# Patient Record
Sex: Male | Born: 1980 | Race: Black or African American | Hispanic: No | Marital: Married | Smoking: Never smoker
Health system: Southern US, Community
[De-identification: ages and names within clinical notes are randomized; demographics above are authoritative.]

## PROBLEM LIST (undated history)

## (undated) DIAGNOSIS — J45909 Unspecified asthma, uncomplicated: Secondary | ICD-10-CM

---

## 2010-08-03 ENCOUNTER — Emergency Department (HOSPITAL_COMMUNITY)
Admission: EM | Admit: 2010-08-03 | Discharge: 2010-08-03 | Payer: Self-pay | Source: Home / Self Care | Admitting: Emergency Medicine

## 2012-07-16 ENCOUNTER — Emergency Department (HOSPITAL_COMMUNITY): Payer: Self-pay

## 2012-07-16 ENCOUNTER — Emergency Department (HOSPITAL_COMMUNITY)
Admission: EM | Admit: 2012-07-16 | Discharge: 2012-07-17 | Disposition: A | Discharge status: Home / Self Care | Payer: Self-pay | Attending: Emergency Medicine | Admitting: Emergency Medicine

## 2012-07-16 ENCOUNTER — Encounter (HOSPITAL_COMMUNITY): Payer: Self-pay | Admitting: *Deleted

## 2012-07-16 DIAGNOSIS — Z79899 Other long term (current) drug therapy: Secondary | ICD-10-CM | POA: Insufficient documentation

## 2012-07-16 DIAGNOSIS — R059 Cough, unspecified: Secondary | ICD-10-CM | POA: Insufficient documentation

## 2012-07-16 DIAGNOSIS — J029 Acute pharyngitis, unspecified: Secondary | ICD-10-CM | POA: Insufficient documentation

## 2012-07-16 DIAGNOSIS — J3489 Other specified disorders of nose and nasal sinuses: Secondary | ICD-10-CM | POA: Insufficient documentation

## 2012-07-16 DIAGNOSIS — J45909 Unspecified asthma, uncomplicated: Secondary | ICD-10-CM

## 2012-07-16 DIAGNOSIS — J45901 Unspecified asthma with (acute) exacerbation: Secondary | ICD-10-CM | POA: Insufficient documentation

## 2012-07-16 DIAGNOSIS — R071 Chest pain on breathing: Secondary | ICD-10-CM | POA: Insufficient documentation

## 2012-07-16 DIAGNOSIS — R05 Cough: Secondary | ICD-10-CM | POA: Insufficient documentation

## 2012-07-16 HISTORY — DX: Unspecified asthma, uncomplicated: J45.909

## 2012-07-16 LAB — CBC WITH DIFFERENTIAL/PLATELET
Basophils Absolute: 0 10*3/uL (ref 0.0–0.1)
Basophils Relative: 1 % (ref 0–1)
Eosinophils Absolute: 0.7 10*3/uL (ref 0.0–0.7)
Eosinophils Relative: 10 % — ABNORMAL HIGH (ref 0–5)
MCH: 26.5 pg (ref 26.0–34.0)
MCV: 79.5 fL (ref 78.0–100.0)
Neutrophils Relative %: 34 % — ABNORMAL LOW (ref 43–77)
Platelets: 198 10*3/uL (ref 150–400)
RDW: 13.5 % (ref 11.5–15.5)

## 2012-07-16 LAB — D-DIMER, QUANTITATIVE: D-Dimer, Quant: <0.27 ug/mL-FEU (ref 0.00–0.48)

## 2012-07-16 LAB — BASIC METABOLIC PANEL
Calcium: 9.3 mg/dL (ref 8.4–10.5)
GFR calc Af Amer: >90 mL/min (ref >90)
GFR calc non Af Amer: 80 mL/min — ABNORMAL LOW (ref >90)
Potassium: 3.9 mEq/L (ref 3.5–5.1)
Sodium: 136 mEq/L (ref 135–145)

## 2012-07-16 MED ORDER — PREDNISONE 50 MG PO TABS: ORAL_TABLET | ORAL | Status: DC

## 2012-07-16 MED ORDER — ALBUTEROL SULFATE (5 MG/ML) 0.5% IN NEBU
5.0000 mg | INHALATION_SOLUTION | Freq: Once | RESPIRATORY_TRACT | Status: AC
  Administered 2012-07-16: 5 mg via RESPIRATORY_TRACT
  Filled 2012-07-16: qty 1

## 2012-07-16 MED ORDER — IPRATROPIUM BROMIDE 0.02 % IN SOLN
0.5000 mg | Freq: Once | RESPIRATORY_TRACT | Status: AC
  Administered 2012-07-16: 0.5 mg via RESPIRATORY_TRACT
  Filled 2012-07-16: qty 2.5

## 2012-07-16 MED ORDER — PREDNISONE 20 MG PO TABS
60.0000 mg | ORAL_TABLET | Freq: Once | ORAL | Status: AC
  Administered 2012-07-16: 60 mg via ORAL
  Filled 2012-07-16: qty 3

## 2012-07-16 MED ORDER — ALBUTEROL SULFATE (5 MG/ML) 0.5% IN NEBU
5.0000 mg | INHALATION_SOLUTION | Freq: Once | RESPIRATORY_TRACT | Status: AC
  Administered 2012-07-16: 5 mg via RESPIRATORY_TRACT

## 2012-07-16 MED ORDER — ALBUTEROL SULFATE HFA 108 (90 BASE) MCG/ACT IN AERS: 2.0000 | INHALATION_SPRAY | RESPIRATORY_TRACT | Status: DC | PRN

## 2012-07-16 MED ORDER — ALBUTEROL SULFATE (5 MG/ML) 0.5% IN NEBU
INHALATION_SOLUTION | RESPIRATORY_TRACT | Status: AC
  Filled 2012-07-16: qty 1

## 2012-07-16 NOTE — ED Provider Notes (Signed)
History     CSN: [id number]  Arrival date & time 07/16/12  1941   First MD Initiated Contact with Patient 07/16/12 2045      Chief Complaint  Patient presents with  . Shortness of Breath  . Asthma    (Consider location/radiation/quality/duration/timing/severity/associated sxs/prior treatment) HPI Comments: Patient reports history of asthma and difficulty breathing for the past day. Associated with dry cough, runny nose, sore throat, congestion. Denies any fevers. Has some anterior chest pain with coughing and deep breathing. No abdominal pain, nausea or vomiting. Has not been in the hospital for asthma since she was a child. Does not smoke. Does not use any asthma medications at home.  The history is provided by the patient.    Past Medical History  Diagnosis Date  . Asthma     History reviewed. No pertinent past surgical history.  No family history on file.  History  Substance Use Topics  . Smoking status: Never Smoker   . Smokeless tobacco: Not on file  . Alcohol Use: No      Review of Systems  Constitutional: Negative for fever, activity change and appetite change.  HENT: Positive for congestion and rhinorrhea. Negative for sore throat.   Respiratory: Positive for cough and shortness of breath.   Cardiovascular: Positive for chest pain.  Gastrointestinal: Negative for nausea, vomiting and abdominal pain.  Genitourinary: Negative for dysuria and hematuria.  Musculoskeletal: Negative for back pain.  Skin: Negative for rash.  Neurological: Negative for dizziness, weakness and headaches.  A complete 10 system review of systems was obtained and all systems are negative except as noted in the HPI and PMH.    Allergies  Review of patient's allergies indicates no known allergies.  Home Medications   Current Outpatient Rx  Name  Route  Sig  Dispense  Refill  . PSEUDOEPH-DOXYLAMINE-DM-APAP 60-7.12-22-998 MG/30ML PO LIQD   Oral   Take 10 mLs by mouth at bedtime  as needed. For cough/cold         . ALBUTEROL SULFATE HFA 108 (90 BASE) MCG/ACT IN AERS   Inhalation   Inhale 2 puffs into the lungs every 4 (four) hours as needed for wheezing.   1 Inhaler   0   . PREDNISONE 50 MG PO TABS      1 tablet PO daily   5 tablet   0     BP 128/83  Pulse 77  Temp 98.4 F (36.9 C) (Oral)  Resp 16  SpO2 98%  Physical Exam  Constitutional: He is oriented to person, place, and time. He appears well-developed and well-nourished. No distress.       Speaking in full senses, no distress  HENT:  Head: Normocephalic and atraumatic.  Mouth/Throat: Oropharynx is clear and moist. No oropharyngeal exudate.  Eyes: Conjunctivae normal and EOM are normal. Pupils are equal, round, and reactive to light.  Neck: Normal range of motion. Neck supple.  Cardiovascular: Normal rate, regular rhythm and normal heart sounds.   No murmur heard. Pulmonary/Chest: Effort normal. No respiratory distress. He has wheezes.       Scattered wheezes bilaterally  Abdominal: Soft. There is no tenderness. There is no rebound and no guarding.  Musculoskeletal: Normal range of motion. He exhibits no edema and no tenderness.  Neurological: He is alert and oriented to person, place, and time. No cranial nerve deficit. He exhibits normal muscle tone. Coordination normal.  Skin: Skin is warm.    ED Course  Procedures (including critical care time)  Labs Reviewed  CBC WITH DIFFERENTIAL - Abnormal; Notable for the following:    Neutrophils Relative 34 (*)     Monocytes Relative 14 (*)     Eosinophils Relative 10 (*)     All other components within normal limits  BASIC METABOLIC PANEL - Abnormal; Notable for the following:    Glucose, Bld 104 (*)     GFR calc non Af Amer 80 (*)     All other components within normal limits  D-DIMER, QUANTITATIVE   Dg Chest 2 View  07/16/2012  *RADIOLOGY REPORT*  Clinical Data: Chest pain, cough and congestion.  CHEST - 2 VIEW  Comparison: None.   Findings: Lungs are clear.  Heart size is normal.  No pneumothorax or pleural fluid.  IMPRESSION: Negative chest.   Original Report Authenticated By: Holley Dexter, M.D.      1. Asthma       MDM  Cough, congestion, wheezing, history of asthma. Scattered wheezes on exam. No distress. D-dimer negative. Chest x-ray negative. Breathing improved after nebulizer and steroids. Patient speaking in full sentences in no distress.  We'll treat her asthma exacerbation with steroids and nebulizers. He is ambulatory in the ED without desaturation.      Glynn Octave, MD 07/16/12 [phone]

## 2012-07-16 NOTE — ED Notes (Addendum)
C/o sob, wheezing, cough (intermittant productive yellow thick), h/o asthma, onset 1d ago, (denies fever), no meds PTA. Last flare up years ago. Speaking in short phrases, insp/exp wheeze noted.

## 2012-08-03 ENCOUNTER — Emergency Department (HOSPITAL_COMMUNITY)
Admission: EM | Admit: 2012-08-03 | Discharge: 2012-08-03 | Disposition: A | Discharge status: Home / Self Care | Payer: Self-pay | Attending: Emergency Medicine | Admitting: Emergency Medicine

## 2012-08-03 ENCOUNTER — Emergency Department (HOSPITAL_COMMUNITY): Payer: Self-pay

## 2012-08-03 ENCOUNTER — Encounter (HOSPITAL_COMMUNITY): Payer: Self-pay | Admitting: Emergency Medicine

## 2012-08-03 DIAGNOSIS — Y939 Activity, unspecified: Secondary | ICD-10-CM | POA: Insufficient documentation

## 2012-08-03 DIAGNOSIS — S93409A Sprain of unspecified ligament of unspecified ankle, initial encounter: Secondary | ICD-10-CM | POA: Insufficient documentation

## 2012-08-03 DIAGNOSIS — W010XXA Fall on same level from slipping, tripping and stumbling without subsequent striking against object, initial encounter: Secondary | ICD-10-CM | POA: Insufficient documentation

## 2012-08-03 DIAGNOSIS — Y9269 Other specified industrial and construction area as the place of occurrence of the external cause: Secondary | ICD-10-CM | POA: Insufficient documentation

## 2012-08-03 DIAGNOSIS — J45909 Unspecified asthma, uncomplicated: Secondary | ICD-10-CM | POA: Insufficient documentation

## 2012-08-03 DIAGNOSIS — Y990 Civilian activity done for income or pay: Secondary | ICD-10-CM | POA: Insufficient documentation

## 2012-08-03 MED ORDER — OXYCODONE-ACETAMINOPHEN 5-325 MG PO TABS
2.0000 | ORAL_TABLET | Freq: Once | ORAL | Status: AC
  Administered 2012-08-03: 2 via ORAL
  Filled 2012-08-03: qty 2

## 2012-08-03 MED ORDER — OXYCODONE-ACETAMINOPHEN 5-325 MG PO TABS: 2.0000 | ORAL_TABLET | ORAL | Status: DC | PRN

## 2012-08-03 NOTE — ED Notes (Signed)
Patient transported to X-ray 

## 2012-08-03 NOTE — ED Notes (Addendum)
Pt here with c/o left ankle pain that he injured his at work this morning. Pt states stepped on the card wood box slipped. Swelling noted at lateral left ankle. He denies fall or hitting head. Reports pain left ankles pain 10/10.

## 2012-08-03 NOTE — Progress Notes (Signed)
Orthopedic Tech Progress Note Patient Details:  Charles Chase 1980/07/31 [id number] Ankle brace applied to Left LE. Patient tolerated application well. Crutches fitted to patient height and comfort.  Ortho Devices Type of Ortho Device: ASO Ortho Device/Splint Location: Left  Ortho Device/Splint Interventions: Application   [name] 08/03/2012, 10:50 AM

## 2012-08-03 NOTE — ED Provider Notes (Signed)
Medical screening examination/treatment/procedure(s) were performed by non-physician practitioner and as supervising physician I was immediately available for consultation/collaboration.   Richardean Canal, MD 08/03/12 [phone]

## 2012-08-03 NOTE — ED Provider Notes (Signed)
History     CSN: [id number]  Arrival date & time 08/03/12  5366   First MD Initiated Contact with Patient 08/03/12 [phone]      Chief Complaint  Patient presents with  . Ankle Injury    (Consider location/radiation/quality/duration/timing/severity/associated sxs/prior treatment) HPI Comments: Patient is a 32 year old male who presents with left ankle pain that started today. The mechanism of injury was sudden ankle inversion. Patient reports sudden onset of aching, severe pain that is localized to left ankle. Patient reports progressive worsening of pain. Ankle movement and weight bearing activity make the pain worse. Nothing makes the pain better. Patient reports associated swelling. Patient has not tried anything for pain relief. Patient denies obvious deformity, numbness/tingling, coolness/weakness of extremity, bruising, and any other injury.      Past Medical History  Diagnosis Date  . Asthma     History reviewed. No pertinent past surgical history.  Family History  Problem Relation Age of Onset  . Diabetes Mother   . Hypertension Mother     History  Substance Use Topics  . Smoking status: Never Smoker   . Smokeless tobacco: Not on file  . Alcohol Use: No      Review of Systems  Musculoskeletal: Positive for joint swelling and arthralgias.  All other systems reviewed and are negative.    Allergies  Review of patient's allergies indicates no known allergies.  Home Medications   Current Outpatient Rx  Name  Route  Sig  Dispense  Refill  . ALBUTEROL SULFATE HFA 108 (90 BASE) MCG/ACT IN AERS   Inhalation   Inhale 2 puffs into the lungs every 4 (four) hours as needed for wheezing.   1 Inhaler   0   . PREDNISONE 50 MG PO TABS      1 tablet PO daily   5 tablet   0   . PSEUDOEPH-DOXYLAMINE-DM-APAP 60-7.12-22-998 MG/30ML PO LIQD   Oral   Take 10 mLs by mouth at bedtime as needed. For cough/cold           BP 131/89  Pulse 106  Temp 98 F (36.7 C)  (Oral)  Resp 20  SpO2 97%  Physical Exam  Nursing note and vitals reviewed. Constitutional: He is oriented to person, place, and time. He appears well-developed and well-nourished. No distress.  HENT:  Head: Normocephalic and atraumatic.  Eyes: Conjunctivae normal are normal.  Neck: Normal range of motion. Neck supple.  Cardiovascular: Normal rate and regular rhythm.  Exam reveals no gallop and no friction rub.   No murmur heard. Pulmonary/Chest: Effort normal and breath sounds normal. He has no wheezes. He has no rales. He exhibits no tenderness.  Abdominal: Soft. There is no tenderness.  Musculoskeletal:       Left ankle edema noted to lateral aspect. Left ankle ROM limited due to pain.   Neurological: He is alert and oriented to person, place, and time. Coordination normal.       Left ankle strength limited due to pain. Sensation equal and intact bilaterally. Speech is goal-oriented. Moves limbs without ataxia.   Skin: Skin is warm and dry.  Psychiatric: He has a normal mood and affect. His behavior is normal.    ED Course  Procedures (including critical care time)  Labs Reviewed - No data to display No results found.   1. Ankle sprain       MDM  9:52 AM Patient will have Percocet for pain. Left ankle xray pending. No signs of neurovascular  compromise.   Xray unremarkable. Patient will have splint, crutches, and pain medication.       Emilia Beck, PA-C 08/03/12 1535

## 2012-10-29 ENCOUNTER — Encounter (HOSPITAL_COMMUNITY): Payer: Self-pay | Admitting: Emergency Medicine

## 2012-10-29 ENCOUNTER — Emergency Department (HOSPITAL_COMMUNITY): Payer: Self-pay

## 2012-10-29 ENCOUNTER — Emergency Department (HOSPITAL_COMMUNITY)
Admission: EM | Admit: 2012-10-29 | Discharge: 2012-10-29 | Disposition: A | Discharge status: Home / Self Care | Payer: Self-pay | Attending: Emergency Medicine | Admitting: Emergency Medicine

## 2012-10-29 DIAGNOSIS — S99929A Unspecified injury of unspecified foot, initial encounter: Secondary | ICD-10-CM | POA: Insufficient documentation

## 2012-10-29 DIAGNOSIS — M25562 Pain in left knee: Secondary | ICD-10-CM

## 2012-10-29 DIAGNOSIS — J45909 Unspecified asthma, uncomplicated: Secondary | ICD-10-CM | POA: Insufficient documentation

## 2012-10-29 DIAGNOSIS — Y990 Civilian activity done for income or pay: Secondary | ICD-10-CM | POA: Insufficient documentation

## 2012-10-29 DIAGNOSIS — S8990XA Unspecified injury of unspecified lower leg, initial encounter: Secondary | ICD-10-CM | POA: Insufficient documentation

## 2012-10-29 DIAGNOSIS — Y9389 Activity, other specified: Secondary | ICD-10-CM | POA: Insufficient documentation

## 2012-10-29 DIAGNOSIS — Y9289 Other specified places as the place of occurrence of the external cause: Secondary | ICD-10-CM | POA: Insufficient documentation

## 2012-10-29 DIAGNOSIS — W319XXA Contact with unspecified machinery, initial encounter: Secondary | ICD-10-CM | POA: Insufficient documentation

## 2012-10-29 MED ORDER — OXYCODONE-ACETAMINOPHEN 5-325 MG PO TABS
1.0000 | ORAL_TABLET | Freq: Once | ORAL | Status: AC
  Administered 2012-10-29: 1 via ORAL
  Filled 2012-10-29: qty 1

## 2012-10-29 MED ORDER — OXYCODONE-ACETAMINOPHEN 5-325 MG PO TABS: 1.0000 | ORAL_TABLET | ORAL | Status: DC | PRN

## 2012-10-29 NOTE — ED Notes (Signed)
Ice applied

## 2012-10-29 NOTE — ED Provider Notes (Signed)
History     CSN: [id number]  Arrival date & time 10/29/12  3086   First MD Initiated Contact with Patient 10/29/12 1027      Chief Complaint  Patient presents with  . Knee Pain    (Consider location/radiation/quality/duration/timing/severity/associated sxs/prior treatment) HPI Comments: Pt presents to the ED for left knee pain following an accident at work this am.  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  Now has left knee pain, increased with weight bearing, and cannot bend his knee all the way.  Denies any numbness or paresthesias in his LLE.  No prior knee injuries.  Ice pack placed in place with minimal relief.  Has not tried any OTC meds PTA.  The history is provided by the patient.    Past Medical History  Diagnosis Date  . Asthma     History reviewed. No pertinent past surgical history.  Family History  Problem Relation Age of Onset  . Diabetes Mother   . Hypertension Mother     History  Substance Use Topics  . Smoking status: Never Smoker   . Smokeless tobacco: Not on file  . Alcohol Use: No      Review of Systems  Musculoskeletal: Positive for arthralgias.  All other systems reviewed and are negative.    Allergies  Review of patient's allergies indicates no known allergies.  Home Medications  No current outpatient prescriptions on file.  BP 104/58  Pulse 74  Temp(Src) 97.4 F (36.3 C) (Oral)  Resp 16  SpO2 99%  Physical Exam  Nursing note and vitals reviewed. Constitutional: He is oriented to person, place, and time. He appears well-developed and well-nourished.  HENT:  Head: Normocephalic and atraumatic.  Mouth/Throat: Oropharynx is clear and moist.  Eyes: Conjunctivae and EOM are normal. Pupils are equal, round, and reactive to light.  Neck: Normal range of motion.  Cardiovascular: Normal rate, regular rhythm and normal heart sounds.   Pulmonary/Chest: Effort normal  and breath sounds normal.  Musculoskeletal:       Left knee: He exhibits decreased range of motion. He exhibits no swelling, no effusion, no ecchymosis, no deformity, no laceration and no erythema. Tenderness found. Medial joint line and lateral joint line tenderness noted.  Tenderness along medial and lateral joint lines with decreased flexion; no deformity, effusion, ecchymosis, or swelling appreciated  Neurological: He is alert and oriented to person, place, and time.  Skin: Skin is warm and dry.  Psychiatric: He has a normal mood and affect.    ED Course  Procedures (including critical care time)  Labs Reviewed - No data to display Dg Knee Complete 4 Views Left  10/29/2012  *RADIOLOGY REPORT*  Clinical Data: Knee pain  LEFT KNEE - COMPLETE 4+ VIEW  Comparison: None  Findings: There is no evidence of fracture or dislocation.  There is no evidence of arthropathy or other focal bone abnormality. Soft tissues are unremarkable.  IMPRESSION: Negative exam.   Original Report Authenticated By: Signa Kell, M.D.      1. Knee pain, left       MDM  (triage note states R knee pain, is actually L knee)  L knee pain following injury at work.  X-ray negative for acute fx or dislocation.  No erythema, swelling, effusion, laceration, or deformity noted to knee.  Limited flexion of knee- internal injury cannot be excluded.  Knee immobilizer placed in the ED and given crutches.  FU with orthopedics- Dr. Shelle Iron.  Rx percocet.  Return precautions advised.       Garlon Hatchet, PA-C 10/29/12 1952

## 2012-10-29 NOTE — ED Notes (Signed)
Pt. Stated, i was at work and got my rt. Knee caught in between 2 belts made out of metal. Rt. Knee pain

## 2012-10-29 NOTE — Progress Notes (Signed)
Orthopedic Tech Progress Note Patient Details:  Charles Chase 11/04/1980 [id number]  Ortho Devices Type of Ortho Device: Crutches;Knee Immobilizer Ortho Device/Splint Interventions: Application   Shawnie Pons 10/29/2012, 11:42 AM

## 2012-10-30 NOTE — ED Provider Notes (Signed)
Medical screening examination/treatment/procedure(s) were performed by non-physician practitioner and as supervising physician I was immediately available for consultation/collaboration.   Carleene Cooper III, MD 10/30/12 1235

## 2013-01-13 ENCOUNTER — Encounter (HOSPITAL_COMMUNITY): Payer: Self-pay | Admitting: *Deleted

## 2013-01-13 ENCOUNTER — Emergency Department (HOSPITAL_COMMUNITY)
Admission: EM | Admit: 2013-01-13 | Discharge: 2013-01-13 | Disposition: A | Discharge status: Home / Self Care | Payer: Self-pay | Attending: Emergency Medicine | Admitting: Emergency Medicine

## 2013-01-13 DIAGNOSIS — T148XXA Other injury of unspecified body region, initial encounter: Secondary | ICD-10-CM

## 2013-01-13 DIAGNOSIS — X58XXXA Exposure to other specified factors, initial encounter: Secondary | ICD-10-CM | POA: Insufficient documentation

## 2013-01-13 DIAGNOSIS — J45909 Unspecified asthma, uncomplicated: Secondary | ICD-10-CM | POA: Insufficient documentation

## 2013-01-13 DIAGNOSIS — IMO0002 Reserved for concepts with insufficient information to code with codable children: Secondary | ICD-10-CM | POA: Insufficient documentation

## 2013-01-13 DIAGNOSIS — Y929 Unspecified place or not applicable: Secondary | ICD-10-CM | POA: Insufficient documentation

## 2013-01-13 DIAGNOSIS — Y939 Activity, unspecified: Secondary | ICD-10-CM | POA: Insufficient documentation

## 2013-01-13 MED ORDER — HYDROCODONE-ACETAMINOPHEN 5-325 MG PO TABS: 2.0000 | ORAL_TABLET | ORAL | Status: DC | PRN

## 2013-01-13 MED ORDER — METHOCARBAMOL 500 MG PO TABS: 500.0000 mg | ORAL_TABLET | Freq: Two times a day (BID) | ORAL | Status: DC | PRN

## 2013-01-13 NOTE — ED Provider Notes (Signed)
History  This chart was scribed for non-physician practitioner working with Ethelda Chick, MD by Greggory Stallion, ED scribe. This patient was seen in room TR07C/TR07C and the patient's care was started at 5:40 PM.  CSN: [id number]  Arrival date & time 01/13/13  1709    Chief Complaint  Patient presents with  . Leg Pain    The history is provided by the patient. No language interpreter was used.    HPI Comments:  Charles Chase is a 32 y.o. male who presents to the Emergency Department complaining of gradual onset, constant left upper thigh pain x 3 days which he describes as a "soreness". Pt denies injury or trauma to the leg. Pain worse with movement and ambulation.  Pain does not radiate into the groin, testicles, or down the leg.  Pt works as a Public librarian.  Denies any numbness or paresthesias of LE.  He states he has tried icing leg and applying heating pad without relief. No pain meds PTA.  No prior muscle strain.  Past Medical History  Diagnosis Date  . Asthma     History reviewed. No pertinent past surgical history.  Family History  Problem Relation Age of Onset  . Diabetes Mother   . Hypertension Mother     History  Substance Use Topics  . Smoking status: Never Smoker   . Smokeless tobacco: Not on file  . Alcohol Use: No      Review of Systems  Musculoskeletal: Positive for myalgias.  All other systems reviewed and are negative.    Allergies  Review of patient's allergies indicates no known allergies.  Home Medications  No current outpatient prescriptions on file.  BP 116/62  Pulse 67  Temp(Src) 98.4 F (36.9 C) (Oral)  Resp 18  SpO2 95%  Physical Exam  Nursing note and vitals reviewed. Constitutional: He is oriented to person, place, and time. He appears well-developed and well-nourished.  HENT:  Head: Normocephalic and atraumatic.  Eyes: Conjunctivae and EOM are normal.  Neck: Normal range of motion. Neck supple.  Cardiovascular:  Normal rate, regular rhythm and normal heart sounds.   Pulmonary/Chest: Effort normal and breath sounds normal.  Musculoskeletal: Normal range of motion.       Legs: TTP and muscle spasm of left medial thigh, no visible bulge or radiation into groin, no hernia, distal sensation intact  Neurological: He is alert and oriented to person, place, and time.  Normal gait  Skin: Skin is warm and dry.  Psychiatric: He has a normal mood and affect.    ED Course  Procedures (including critical care time)  DIAGNOSTIC STUDIES: Oxygen Saturation is 95% on RA, adequate by my interpretation.    COORDINATION OF CARE: 5:56 PM-Discussed treatment plan with pt at bedside and pt agreed to plan.   Labs Reviewed - No data to display No results found.   1. Muscle strain       MDM   Pain in left medial likely muscular strain. No visible bulge or radiation into groin.  LLE NVI.  Rx Norco and Robaxin.  Continue with supportive care at home including heat therapy.  Discussed plan with patient, he agreed. Return precautions advised.   I personally performed the services described in this documentation, which was scribed in my presence. The recorded information has been reviewed and is accurate.   Garlon Hatchet, PA-C 01/13/13 1809

## 2013-01-13 NOTE — ED Provider Notes (Signed)
Medical screening examination/treatment/procedure(s) were performed by non-physician practitioner and as supervising physician I was immediately available for consultation/collaboration.  Ethelda Chick, MD 01/13/13 [phone]

## 2013-01-13 NOTE — ED Notes (Signed)
Pt reports having pain to left anterior thigh x 3 days, denies injury. Ambulatory at triage.

## 2013-10-15 ENCOUNTER — Emergency Department (HOSPITAL_COMMUNITY)
Admission: EM | Admit: 2013-10-15 | Discharge: 2013-10-15 | Disposition: A | Discharge status: Home / Self Care | Payer: Self-pay | Attending: Emergency Medicine | Admitting: Emergency Medicine

## 2013-10-15 ENCOUNTER — Encounter (HOSPITAL_COMMUNITY): Payer: Self-pay | Admitting: Emergency Medicine

## 2013-10-15 DIAGNOSIS — J45909 Unspecified asthma, uncomplicated: Secondary | ICD-10-CM | POA: Insufficient documentation

## 2013-10-15 DIAGNOSIS — M62838 Other muscle spasm: Secondary | ICD-10-CM | POA: Insufficient documentation

## 2013-10-15 MED ORDER — METHOCARBAMOL 500 MG PO TABS: 500.0000 mg | ORAL_TABLET | Freq: Two times a day (BID) | ORAL | Status: DC | PRN

## 2013-10-15 MED ORDER — KETOROLAC TROMETHAMINE 60 MG/2ML IM SOLN
60.0000 mg | Freq: Once | INTRAMUSCULAR | Status: AC
  Administered 2013-10-15: 60 mg via INTRAMUSCULAR
  Filled 2013-10-15: qty 2

## 2013-10-15 MED ORDER — TRAMADOL HCL 50 MG PO TABS: 50.0000 mg | ORAL_TABLET | Freq: Four times a day (QID) | ORAL | Status: DC | PRN

## 2013-10-15 MED ORDER — DIAZEPAM 5 MG/ML IJ SOLN
5.0000 mg | Freq: Once | INTRAMUSCULAR | Status: AC
  Administered 2013-10-15: 5 mg via INTRAVENOUS
  Filled 2013-10-15: qty 2

## 2013-10-15 NOTE — ED Notes (Signed)
Neck pain to both arms, right greater than left. States has some tingling in left arm.

## 2013-10-15 NOTE — Discharge Instructions (Signed)
Take the prescribed medication as directed. Follow-up with [facility] if problems occur. Return to the ED for new or worsening symptoms.

## 2013-10-15 NOTE — ED Notes (Signed)
Pt reports he woke up with neck pain and right arm pain when he moves his neck, sts no injury since last June.

## 2013-10-15 NOTE — ED Provider Notes (Signed)
CSN: [id number]     Arrival date & time 10/15/13  1356 History  This chart was scribed for non-physician practitioner, Sharilyn Sites, PA-C, working with Audree Camel, MD, by Ellin Mayhew, ED Scribe. This patient was seen in room TR06C/TR06C and the patient's care was started at 3:03 PM  The history is provided by the patient. No language interpreter was used.   HPI Comments: Charles Chase is a 33 y.o. male who presents to the Emergency Department with a chief complaint of neck pain. Patient states he woke up this morning with right-sided neck pain with some radiation into his right shoulder. He denies any recent neck injury or trauma, thinks he just "slept wrong."  Pain exacerbated with raising his right arm. He denies any numbness or paresthesias of his extremities. No headaches, fever, or rigidity of neck.  No intervention tried PTA.  Past Medical History  Diagnosis Date  . Asthma    History reviewed. No pertinent past surgical history. Family History  Problem Relation Age of Onset  . Diabetes Mother   . Hypertension Mother    History  Substance Use Topics  . Smoking status: Never Smoker   . Smokeless tobacco: Not on file  . Alcohol Use: No    Review of Systems  Constitutional: Negative for fever, chills, activity change, appetite change and unexpected weight change.  HENT: Negative for congestion and sore throat.   Respiratory: Negative for cough and shortness of breath.   Cardiovascular: Negative for chest pain.  Gastrointestinal: Negative for nausea, vomiting, abdominal pain and diarrhea.  Musculoskeletal: Positive for neck pain.  Neurological: Negative for weakness.  All other systems reviewed and are negative.    Allergies  Review of patient's allergies indicates no known allergies.  Home Medications  No current outpatient prescriptions on file.  Triage Vitals: BP 116/67  Pulse 60  Temp(Src) 97.6 F (36.4 C) (Oral)  Resp 24  Ht 5\' 7"  (1.702 m)  Wt 135 lb  (61.236 kg)  BMI 21.14 kg/m2  SpO2 95%  Physical Exam  Nursing note and vitals reviewed. Constitutional: He is oriented to person, place, and time. He appears well-developed and well-nourished. No distress.  HENT:  Head: Normocephalic and atraumatic.  Mouth/Throat: Oropharynx is clear and moist.  Eyes: Conjunctivae and EOM are normal. Pupils are equal, round, and reactive to light.  Neck: Normal range of motion and full passive range of motion without pain. Neck supple. Muscular tenderness present. No spinous process tenderness present. No rigidity. Normal range of motion present.    Tenderness to palpation along the right cervical paraspinal muscle and trapezius, Full ROM maintained; pain increased with lifting right arm, no midline tenderness or step off, no meningeal signs  Cardiovascular: Normal rate, regular rhythm and normal heart sounds.   Pulmonary/Chest: Effort normal and breath sounds normal. No respiratory distress. He has no wheezes.  Musculoskeletal: Normal range of motion.  Neurological: He is alert and oriented to person, place, and time.  Skin: Skin is warm and dry. He is not diaphoretic.  Psychiatric: He has a normal mood and affect.    ED Course  Procedures (including critical care time)  DIAGNOSTIC STUDIES: Oxygen Saturation is 95% on room air, adequate by my interpretation.    COORDINATION OF CARE: 3:08 PM-Discussed how arm and head movements can exacerbate neck pain. Will order Valium and Tordol to relieve pain. Treatment plan discussed with patient and patient agrees.  Labs Review Labs Reviewed - No data to display Imaging Review No  results found.   EKG Interpretation None      MDM   Final diagnoses:  None   Muscle spasm along the right cervical paraspinal muscles and trapezius. No midline tenderness or step off. No nuchal rigidity, fever, or headache to suggest meningitis. Patient given shot of Toradol and Valium with improvement of symptoms.  Tramadol and Robaxin for home. Followup with [facility] if problems occur.  Discussed plan with pt, they agreed.  Return precautions advised.  I personally performed the services described in this documentation, which was scribed in my presence. The recorded information has been reviewed and is accurate.  Garlon HatchetLisa M Alphonsus Doyel, PA-C 10/15/13 1624

## 2013-10-18 NOTE — ED Provider Notes (Signed)
Medical screening examination/treatment/procedure(s) were performed by non-physician practitioner and as supervising physician I was immediately available for consultation/collaboration.   EKG Interpretation None        Audree CamelScott T Kimberlyn Quiocho, MD 10/18/13 [phone]

## 2014-05-14 IMAGING — CR DG CHEST 2V
2 series · 2 of 2 positions shown · non-contrast
Comparison: None.

CLINICAL DATA: Chest pain, cough and congestion.

CHEST - 2 VIEW

[w chest pa]
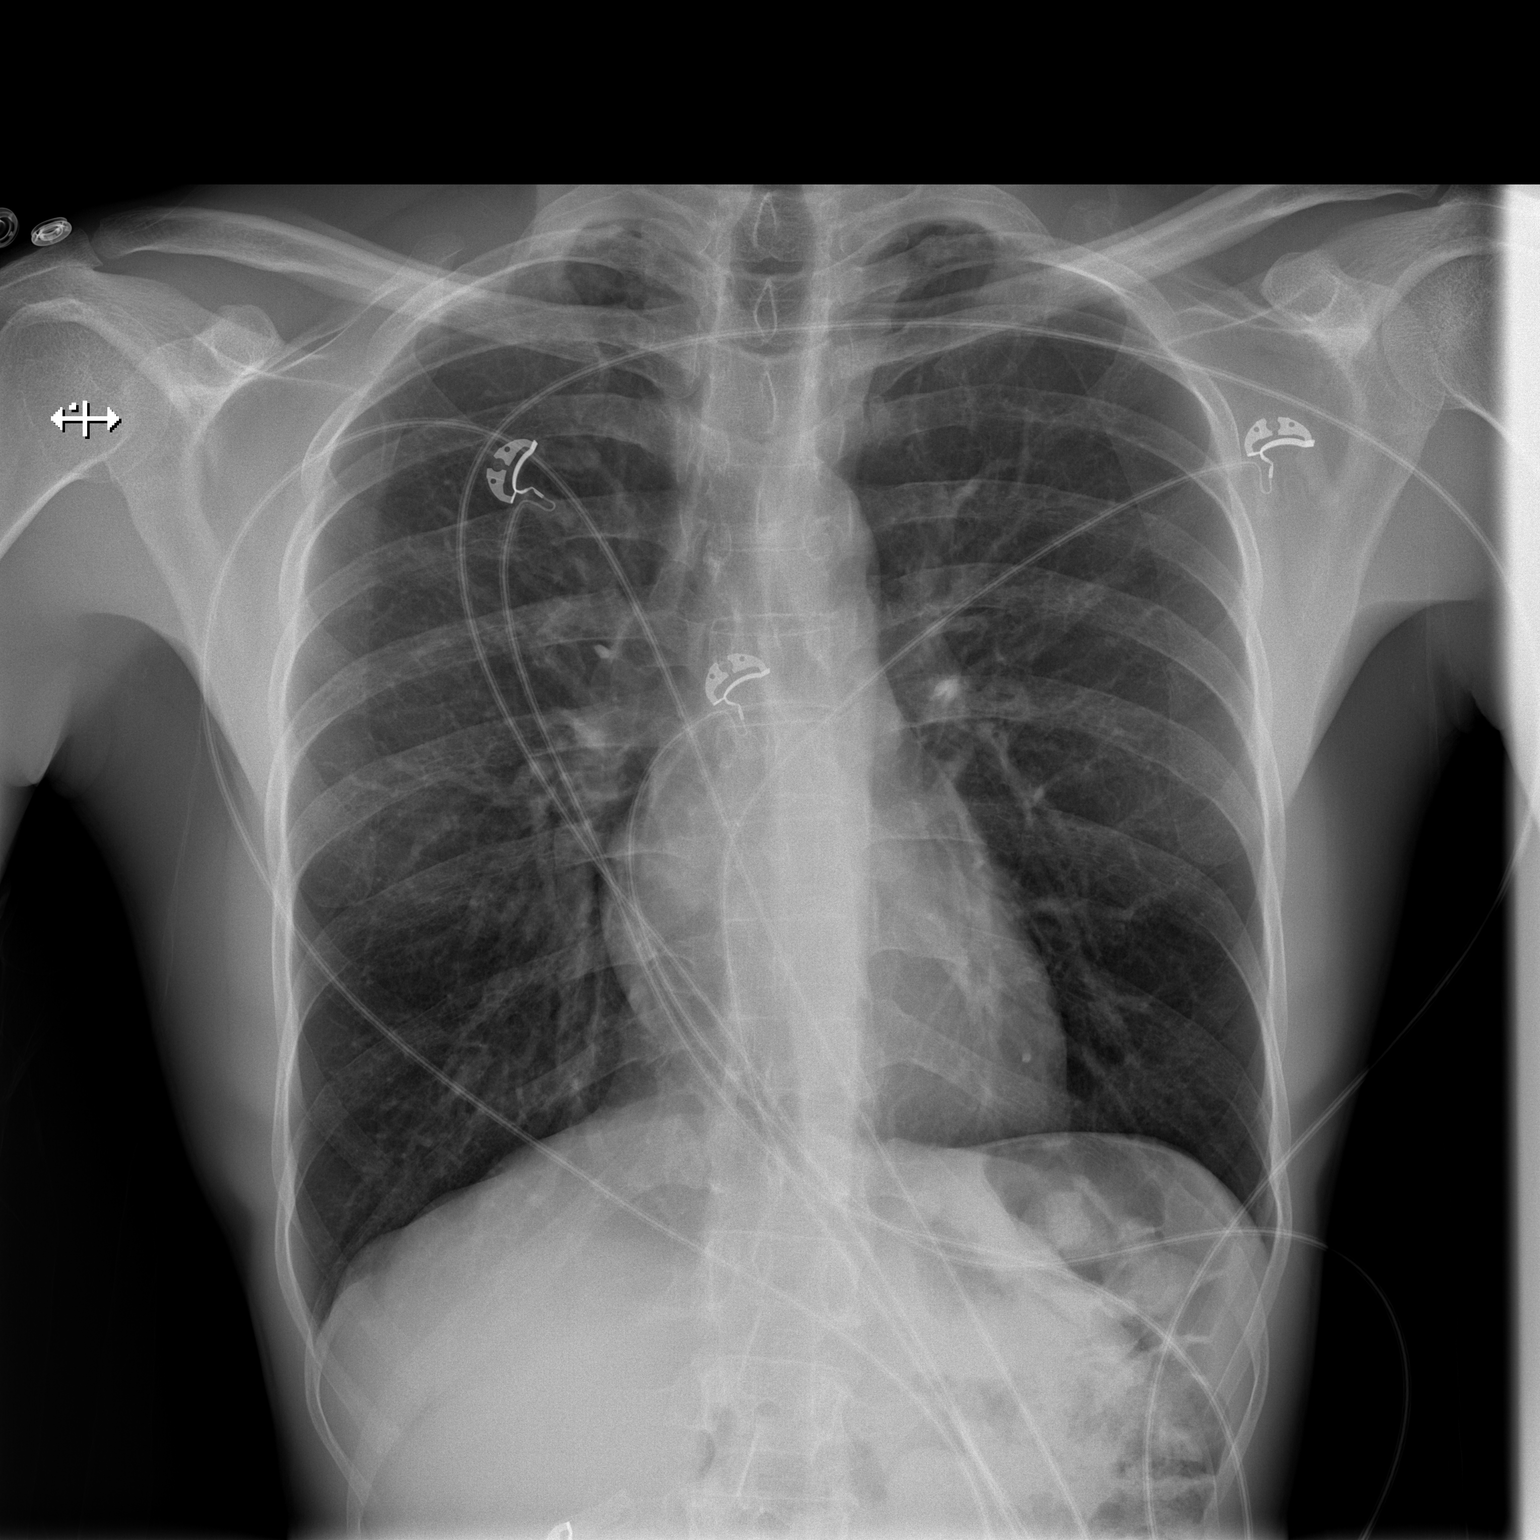

[w chest lat]
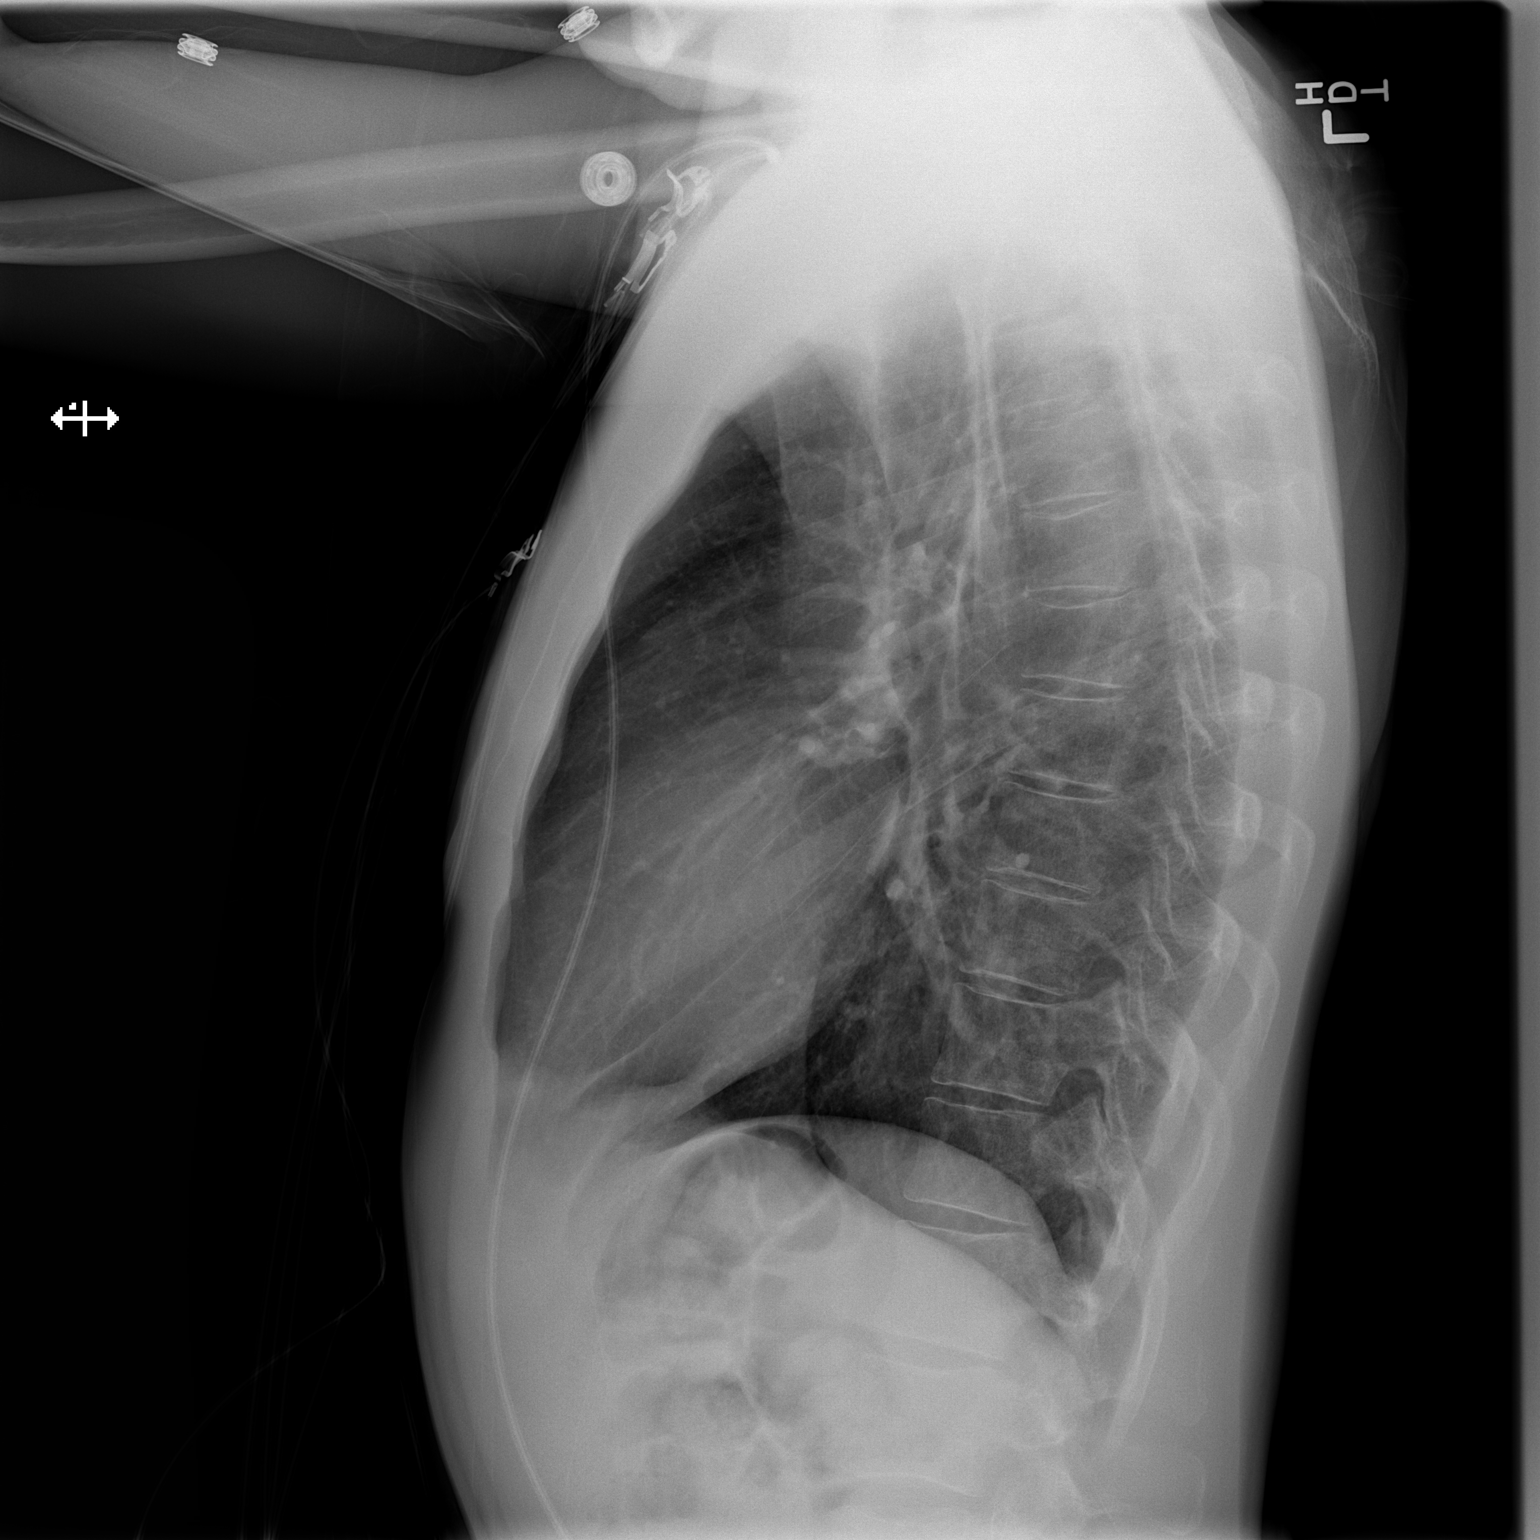

[2 of 2 positions shown; findings below may reference images not displayed]

FINDINGS: Lungs are clear.  Heart size is normal.  No pneumothorax
or pleural fluid.
IMPRESSION: Negative chest.

## 2014-10-13 ENCOUNTER — Emergency Department (HOSPITAL_COMMUNITY)
Admission: EM | Admit: 2014-10-13 | Discharge: 2014-10-13 | Disposition: A | Discharge status: Home / Self Care | Payer: Self-pay | Attending: Emergency Medicine | Admitting: Emergency Medicine

## 2014-10-13 ENCOUNTER — Encounter (HOSPITAL_COMMUNITY): Payer: Self-pay | Admitting: Emergency Medicine

## 2014-10-13 ENCOUNTER — Emergency Department (HOSPITAL_COMMUNITY): Payer: Self-pay

## 2014-10-13 DIAGNOSIS — R630 Anorexia: Secondary | ICD-10-CM | POA: Insufficient documentation

## 2014-10-13 DIAGNOSIS — J45901 Unspecified asthma with (acute) exacerbation: Secondary | ICD-10-CM | POA: Insufficient documentation

## 2014-10-13 DIAGNOSIS — J189 Pneumonia, unspecified organism: Secondary | ICD-10-CM

## 2014-10-13 DIAGNOSIS — J159 Unspecified bacterial pneumonia: Secondary | ICD-10-CM | POA: Insufficient documentation

## 2014-10-13 LAB — BASIC METABOLIC PANEL
ANION GAP: 11 (ref 5–15)
BUN: 13 mg/dL (ref 6–23)
CHLORIDE: 97 mmol/L (ref 96–112)
CO2: 26 mmol/L (ref 19–32)
Calcium: 9.1 mg/dL (ref 8.4–10.5)
Creatinine, Ser: 1.03 mg/dL (ref 0.50–1.35)
GLUCOSE: 107 mg/dL — AB (ref 70–99)
Potassium: 4.4 mmol/L (ref 3.5–5.1)
Sodium: 134 mmol/L — ABNORMAL LOW (ref 135–145)

## 2014-10-13 LAB — CBC
HCT: 40.3 % (ref 39.0–52.0)
Hemoglobin: 13.3 g/dL (ref 13.0–17.0)
MCH: 26.5 pg (ref 26.0–34.0)
MCHC: 33 g/dL (ref 30.0–36.0)
MCV: 80.3 fL (ref 78.0–100.0)
Platelets: 290 10*3/uL (ref 150–400)
RBC: 5.02 MIL/uL (ref 4.22–5.81)
RDW: 13.4 % (ref 11.5–15.5)
WBC: 15.9 10*3/uL — ABNORMAL HIGH (ref 4.0–10.5)

## 2014-10-13 LAB — DIFFERENTIAL
BASOS PCT: 0 % (ref 0–1)
Basophils Absolute: 0 10*3/uL (ref 0.0–0.1)
EOS ABS: 0 10*3/uL (ref 0.0–0.7)
Eosinophils Relative: 0 % (ref 0–5)
Lymphocytes Relative: 14 % (ref 12–46)
Lymphs Abs: 2.3 10*3/uL (ref 0.7–4.0)
MONOS PCT: 16 % — AB (ref 3–12)
Monocytes Absolute: 2.6 10*3/uL — ABNORMAL HIGH (ref 0.1–1.0)
NEUTROS ABS: 11.5 10*3/uL — AB (ref 1.7–7.7)
Neutrophils Relative %: 70 % (ref 43–77)

## 2014-10-13 LAB — I-STAT TROPONIN, ED: TROPONIN I, POC: 0 ng/mL (ref 0.00–0.08)

## 2014-10-13 MED ORDER — SODIUM CHLORIDE 0.9 % IV BOLUS (SEPSIS)
1000.0000 mL | Freq: Once | INTRAVENOUS | Status: AC
  Administered 2014-10-13: 1000 mL via INTRAVENOUS

## 2014-10-13 MED ORDER — DEXTROSE 5 % IV SOLN
1.0000 g | Freq: Once | INTRAVENOUS | Status: AC
  Administered 2014-10-13: 1 g via INTRAVENOUS
  Filled 2014-10-13: qty 10

## 2014-10-13 MED ORDER — KETOROLAC TROMETHAMINE 30 MG/ML IJ SOLN
30.0000 mg | Freq: Once | INTRAMUSCULAR | Status: AC
  Administered 2014-10-13: 30 mg via INTRAVENOUS
  Filled 2014-10-13: qty 1

## 2014-10-13 MED ORDER — AZITHROMYCIN 250 MG PO TABS: 250.0000 mg | ORAL_TABLET | Freq: Every day | ORAL | Status: DC

## 2014-10-13 MED ORDER — AZITHROMYCIN 250 MG PO TABS
500.0000 mg | ORAL_TABLET | Freq: Once | ORAL | Status: AC
  Administered 2014-10-13: 500 mg via ORAL
  Filled 2014-10-13: qty 2

## 2014-10-13 NOTE — ED Provider Notes (Signed)
CSN: [id number]     Arrival date & time 10/13/14  7829 History   First MD Initiated Contact with Patient 10/13/14 0411     Chief Complaint  Patient presents with  . Shortness of Breath     (Consider location/radiation/quality/duration/timing/severity/associated sxs/prior Treatment) HPI Comments: He complains of cough, fever, body, aches, congestion for the past 3 days, fever 2 days ago only, with onset SOB tonight that prompted ED visit. He reports a history of asthma but does not have an inhaler to use. He is a nonsmoker. No vomiting. No appetite.   The history is provided by the patient. No language interpreter was used.    Past Medical History  Diagnosis Date  . Asthma    History reviewed. No pertinent past surgical history. Family History  Problem Relation Age of Onset  . Diabetes Mother   . Hypertension Mother    History  Substance Use Topics  . Smoking status: Never Smoker   . Smokeless tobacco: Not on file  . Alcohol Use: No    Review of Systems  Constitutional: Positive for fever and appetite change. Negative for chills.  HENT: Positive for congestion, rhinorrhea and sore throat. Negative for trouble swallowing.   Respiratory: Positive for cough and shortness of breath.   Cardiovascular: Positive for chest pain.       Chest pain with cough.  Gastrointestinal: Negative.  Negative for nausea and vomiting.  Musculoskeletal: Positive for myalgias.  Skin: Negative.  Negative for rash.  Neurological: Positive for weakness.      Allergies  Review of patient's allergies indicates no known allergies.  Home Medications   Prior to Admission medications   Medication Sig Start Date End Date Taking? Authorizing Provider  methocarbamol (ROBAXIN) 500 MG tablet Take 1 tablet (500 mg total) by mouth 2 (two) times daily as needed. 10/15/13   Garlon Hatchet, PA-C  traMADol (ULTRAM) 50 MG tablet Take 1 tablet (50 mg total) by mouth every 6 (six) hours as needed. 10/15/13   Garlon Hatchet, PA-C   BP 121/72 mmHg  Pulse 97  Temp(Src) 99.4 F (37.4 C) (Oral)  Resp 20  Ht  (1.727 m)  Wt 135 lb (61.236 kg)  BMI 20.53 kg/m2  SpO2 100% Physical Exam  Constitutional: He is oriented to person, place, and time. He appears well-developed and well-nourished.  HENT:  Head: Normocephalic.  Neck: Normal range of motion. Neck supple.  Cardiovascular: Normal rate and regular rhythm.   Pulmonary/Chest: Effort normal.  Minimal expiratory wheezing.  Abdominal: Soft. Bowel sounds are normal. There is no tenderness. There is no rebound and no guarding.  Musculoskeletal: Normal range of motion.  Neurological: He is alert and oriented to person, place, and time.  Skin: Skin is warm and dry. No rash noted.  Psychiatric: He has a normal mood and affect.    ED Course  Procedures (including critical care time) Labs Review Labs Reviewed  BASIC METABOLIC PANEL  CBC  I-STAT TROPOININ, ED   Results for orders placed or performed during the hospital encounter of 10/13/14  Basic metabolic panel    (if pt has PMH of COPD)  Result Value Ref Range   Sodium 134 (L) 135 - 145 mmol/L   Potassium 4.4 3.5 - 5.1 mmol/L   Chloride 97 96 - 112 mmol/L   CO2 26 19 - 32 mmol/L   Glucose, Bld 107 (H) 70 - 99 mg/dL   BUN 13 6 - 23 mg/dL   Creatinine, Ser  1.03 0.50 - 1.35 mg/dL   Calcium 9.1 8.4 - 16.110.5 mg/dL   GFR calc non Af Amer >90 >90 mL/min   GFR calc Af Amer >90 >90 mL/min   Anion gap 11 5 - 15  CBC     (if pt has PMH of COPD)  Result Value Ref Range   WBC 15.9 (H) 4.0 - 10.5 K/uL   RBC 5.02 4.22 - 5.81 MIL/uL   Hemoglobin 13.3 13.0 - 17.0 g/dL   HCT 09.640.3 04.539.0 - 40.952.0 %   MCV 80.3 78.0 - 100.0 fL   MCH 26.5 26.0 - 34.0 pg   MCHC 33.0 30.0 - 36.0 g/dL   RDW 81.113.4 91.411.5 - 78.215.5 %   Platelets 290 150 - 400 K/uL  Differential  Result Value Ref Range   Neutrophils Relative % 70 43 - 77 %   Neutro Abs 11.5 (H) 1.7 - 7.7 K/uL   Lymphocytes Relative 14 12 - 46 %   Lymphs Abs 2.3  0.7 - 4.0 K/uL   Monocytes Relative 16 (H) 3 - 12 %   Monocytes Absolute 2.6 (H) 0.1 - 1.0 K/uL   Eosinophils Relative 0 0 - 5 %   Eosinophils Absolute 0.0 0.0 - 0.7 K/uL   Basophils Relative 0 0 - 1 %   Basophils Absolute 0.0 0.0 - 0.1 K/uL  I-stat troponin, ED (if patient has history of COPD)  Result Value Ref Range   Troponin i, poc 0.00 0.00 - 0.08 ng/mL   Comment 3           Dg Chest 2 View (if Patient Has Fever And/or Copd)  10/13/2014   CLINICAL DATA:  Dyspnea, cough and fever for a week  EXAM: CHEST  2 VIEW  COMPARISON:  07/16/2012  FINDINGS: There is airspace consolidation in the left lower lobe retrocardiac region, consistent with infectious infiltrate. There is no effusion. The right lung is clear. Hilar and mediastinal contours are normal. Heart size is normal. Pulmonary vasculature is normal.  IMPRESSION: Left lower lobe pneumonia. Recommend follow-up radiography to confirm complete clearing.   Electronically Signed   By: Ellery Plunkaniel R Mitchell M.D.   On: 10/13/2014 05:15    Imaging Review No results found.   EKG Interpretation None      MDM   Final diagnoses:  None    1. CAP, LLL  Recheck: VSS - no tachycardia, tachypnea, hypoxia. He is sleeping soundly. IV antibiotics given in ED, will discharge home on Z-pack. No PCP. Will provide referrals for primary care. Return precautions.    Elpidio AnisShari Jomari Bartnik, PA-C 10/13/14 [id number]  Geoffery Lyonsouglas Delo, MD 10/13/14 1739

## 2014-10-13 NOTE — ED Notes (Signed)
Pt arrives with c/o SHOB cough and fever ongoing for about a week, states hes been using aleve for s/s management. States hx of asthma. Low grade temp in triage. Dry cough. Diminished lung sounds to L. CP with coughing

## 2014-10-13 NOTE — Discharge Instructions (Signed)
Pneumonia °Pneumonia is an infection of the lungs.  °CAUSES °Pneumonia may be caused by bacteria or a virus. Usually, these infections are caused by breathing infectious particles into the lungs (respiratory tract). °SIGNS AND SYMPTOMS  °· Cough. °· Fever. °· Chest pain. °· Increased rate of breathing. °· Wheezing. °· Mucus production. °DIAGNOSIS  °If you have the common symptoms of pneumonia, your health care provider will typically confirm the diagnosis with a chest X-ray. The X-ray will show an abnormality in the lung (pulmonary infiltrate) if you have pneumonia. Other tests of your blood, urine, or sputum may be done to find the specific cause of your pneumonia. Your health care provider may also do tests (blood gases or pulse oximetry) to see how well your lungs are working. °TREATMENT  °Some forms of pneumonia may be spread to other people when you cough or sneeze. You may be asked to wear a mask before and during your exam. Pneumonia that is caused by bacteria is treated with antibiotic medicine. Pneumonia that is caused by the influenza virus may be treated with an antiviral medicine. Most other viral infections must run their course. These infections will not respond to antibiotics.  °HOME CARE INSTRUCTIONS  °· Cough suppressants may be used if you are losing too much rest. However, coughing protects you by clearing your lungs. You should avoid using cough suppressants if you can. °· Your health care provider may have prescribed medicine if he or she thinks your pneumonia is caused by bacteria or influenza. Finish your medicine even if you start to feel better. °· Your health care provider may also prescribe an expectorant. This loosens the mucus to be coughed up. °· Take medicines only as directed by your health care provider. °· Do not smoke. Smoking is a common cause of bronchitis and can contribute to pneumonia. If you are a smoker and continue to smoke, your cough may last several weeks after your  pneumonia has cleared. °· A cold steam vaporizer or humidifier in your room or home may help loosen mucus. °· Coughing is often worse at night. Sleeping in a semi-upright position in a recliner or using a couple pillows under your head will help with this. °· Get rest as you feel it is needed. Your body will usually let you know when you need to rest. °PREVENTION °A pneumococcal shot (vaccine) is available to prevent a common bacterial cause of pneumonia. This is usually suggested for: °· People over 65 years old. °· Patients on chemotherapy. °· People with chronic lung problems, such as bronchitis or emphysema. °· People with immune system problems. °If you are over 65 or have a high risk condition, you may receive the pneumococcal vaccine if you have not received it before. In some countries, a routine influenza vaccine is also recommended. This vaccine can help prevent some cases of pneumonia. You may be offered the influenza vaccine as part of your care. °If you smoke, it is time to quit. You may receive instructions on how to stop smoking. Your health care provider can provide medicines and counseling to help you quit. °SEEK MEDICAL CARE IF: °You have a fever. °SEEK IMMEDIATE MEDICAL CARE IF:  °· Your illness becomes worse. This is especially true if you are elderly or weakened from any other disease. °· You cannot control your cough with suppressants and are losing sleep. °· You begin coughing up blood. °· You develop pain which is getting worse or is uncontrolled with medicines. °· Any of the symptoms   which initially brought you in for treatment are getting worse rather than better.  You develop shortness of breath or chest pain. MAKE SURE YOU:   Understand these instructions.  Will watch your condition.  Will get help right away if you are not doing well or get worse. Document Released: 07/12/2005 Document Revised: 11/26/2013 Document Reviewed: 10/01/2010 [facility] Patient Information 2015  [place], [place]. This information is not intended to replace advice given to you by your health care provider. Make sure you discuss any questions you have with your health care provider.  Emergency Department Resource Guide 1) Find a Doctor and Pay Out of Pocket Although you won't have to find out who is covered by your insurance plan, it is a good idea to ask around and get recommendations. You will then need to call the office and see if the doctor you have chosen will accept you as a new patient and what types of options they offer for patients who are self-pay. Some doctors offer discounts or will set up payment plans for their patients who do not have insurance, but you will need to ask so you aren't surprised when you get to your appointment.  2) Contact Your Local Health Department Not all health departments have doctors that can see patients for sick visits, but many do, so it is worth a call to see if yours does. If you don't know where your local health department is, you can check in your phone book. The CDC also has a tool to help you locate your state's health department, and many state websites also have listings of all of their local health departments.  3) Find a Walk-in Clinic If your illness is not likely to be very severe or complicated, you may want to try a walk in clinic. These are popping up all over the country in pharmacies, drugstores, and shopping centers. They're usually staffed by nurse practitioners or physician assistants that have been trained to treat common illnesses and complaints. They're usually fairly quick and inexpensive. However, if you have serious medical issues or chronic medical problems, these are probably not your best option.  No Primary Care Doctor: - Call Health Connect at  [phone] - they can help you locate a primary care doctor that  accepts your insurance, provides certain services, etc. - Physician Referral Service- [phone]  Chronic Pain  Problems: Organization         Address  Phone   Notes  [facility]  [phone] Patients need to be referred by their primary care doctor.   Medication Assistance: Organization         Address  Phone   Notes  [facility] [address] [place] [place], [place] [address] [phone] --Must be a resident of [facility] -- Must have NO insurance coverage whatsoever (no Medicaid/ Medicare, etc.) -- The pt. MUST have a primary care doctor that directs their care regularly and follows them in the community   [facility]  [phone]   [employer]  [phone]    Agencies that provide inexpensive medical care: Organization         Address  Phone   Notes  [facility]  [phone]   [facility]    [phone]   [facility] [address] [place], [place] [address] [phone]   [facility] [address], [place] [phone]  [facility]    [phone]   [facility]    [phone]   [facility]  [address], [place] Phone:  [phone], Fax:  [phone]) [phone] Hours of Operation:  9 am - 6 pm, M-F.  Also accepts Medicaid/Medicare and self-pay.  [facility]  [address], [place] Phone: [phone], Fax: [phone]. Hours of Operation:  8:30 am - 5:30 pm, M-F.  Also accepts Medicaid and self-pay.  [facility] [place] [address], [place] Phone: [phone]   [facility] [address], [place], [place] [phone], Ext. [phone] Mondays & Thursdays: 7-9 AM.  First 15 patients are seen on a first come, first serve basis.    Medicaid-accepting [facility] Providers:  Organization         Address  Phone   Notes  [facility] [address], [place] [phone] Also  accepts self-pay patients.  [facility] [address] [place], [place]  [phone]   [facility] [address], [place] [phone]   [facility] [address] [place] [address], [place] [phone]   Renaye RakersVeita Bland [address], [place]   [phone] Only accepts [place] patients after they have their name applied to their card.   Self-Pay (no insurance) in [facility]:  Organization         Address  Phone   Notes  Sickle Cell Patients, [facility] [address], [place] [phone]   [facility] [address], [place] [phone]   [facility]  [address], [place] [phone]   [facility]/Dr. Osei-Bonsu  [address] [place] [address], [place] or [phone], [place] [phone] Phone number for both [place] and [place] locations is the same.  [facility] [address], [place] [phone]   [facility] [address] [place] [address], [place] or [address] [phone]   [facility] [address], [place] [phone], phone; [phone], fax Sees patients 1st and 3rd Saturday of every month.  Must not qualify for public or private insurance (i.e. Medicaid, Medicare, [place] Health Choice, Veterans' Benefits)  Household income should be no more than 200% of the poverty level The clinic cannot treat you if you are pregnant or think you are pregnant  Sexually transmitted diseases are not treated at the clinic.

## 2015-10-01 ENCOUNTER — Emergency Department (HOSPITAL_COMMUNITY)
Admission: EM | Admit: 2015-10-01 | Discharge: 2015-10-01 | Disposition: A | Discharge status: Home / Self Care | Payer: BLUE CROSS/BLUE SHIELD | Attending: Emergency Medicine | Admitting: Emergency Medicine

## 2015-10-01 ENCOUNTER — Emergency Department (HOSPITAL_COMMUNITY): Payer: BLUE CROSS/BLUE SHIELD

## 2015-10-01 ENCOUNTER — Encounter (HOSPITAL_COMMUNITY): Payer: Self-pay | Admitting: Emergency Medicine

## 2015-10-01 DIAGNOSIS — M25571 Pain in right ankle and joints of right foot: Secondary | ICD-10-CM | POA: Diagnosis present

## 2015-10-01 DIAGNOSIS — J45909 Unspecified asthma, uncomplicated: Secondary | ICD-10-CM | POA: Insufficient documentation

## 2015-10-01 MED ORDER — IBUPROFEN 400 MG PO TABS
400.0000 mg | ORAL_TABLET | Freq: Once | ORAL | Status: AC
  Administered 2015-10-01: 400 mg via ORAL
  Filled 2015-10-01: qty 1

## 2015-10-01 MED ORDER — KETOROLAC TROMETHAMINE 60 MG/2ML IM SOLN
60.0000 mg | Freq: Once | INTRAMUSCULAR | Status: AC
  Administered 2015-10-01: 60 mg via INTRAMUSCULAR
  Filled 2015-10-01: qty 2

## 2015-10-01 NOTE — ED Notes (Signed)
Pt. reports right foot pain with swelling onset this week , denies injury or fall /ambulatory , pain increases with movement/weight bearing .

## 2015-10-01 NOTE — Discharge Instructions (Signed)
Ankle Pain Charles Chase, Your xrays do not show any broken bones.  See a primary care doctor within 3 days for close follow up.  Take tylenol or ibuprofen as needed for pain.  If symptoms worsen, come back to the ED immediately.  Thank you. Ankle pain is a common symptom. The bones, cartilage, tendons, and muscles of the ankle joint perform a lot of work each day. The ankle joint holds your body weight and allows you to move around. Ankle pain can occur on either side or back of 1 or both ankles. Ankle pain may be sharp and burning or dull and aching. There may be tenderness, stiffness, redness, or warmth around the ankle. The pain occurs more often when a person walks or puts pressure on the ankle. CAUSES  There are many reasons ankle pain can develop. It is important to work with your caregiver to identify the cause since many conditions can impact the bones, cartilage, muscles, and tendons. Causes for ankle pain include:  Injury, including a break (fracture), sprain, or strain often due to a fall, sports, or a high-impact activity.  Swelling (inflammation) of a tendon (tendonitis).  Achilles tendon rupture.  Ankle instability after repeated sprains and strains.  Poor foot alignment.  Pressure on a nerve (tarsal tunnel syndrome).  Arthritis in the ankle or the lining of the ankle.  Crystal formation in the ankle (gout or pseudogout). DIAGNOSIS  A diagnosis is based on your medical history, your symptoms, results of your physical exam, and results of diagnostic tests. Diagnostic tests may include X-ray exams or a computerized magnetic scan (magnetic resonance imaging, MRI). TREATMENT  Treatment will depend on the cause of your ankle pain and may include:  Keeping pressure off the ankle and limiting activities.  Using crutches or other walking support (a cane or brace).  Using rest, ice, compression, and elevation.  Participating in physical therapy or home exercises.  Wearing shoe  inserts or special shoes.  Losing weight.  Taking medications to reduce pain or swelling or receiving an injection.  Undergoing surgery. HOME CARE INSTRUCTIONS   Only take over-the-counter or prescription medicines for pain, discomfort, or fever as directed by your caregiver.  Put ice on the injured area.  Put ice in a plastic bag.  Place a towel between your skin and the bag.  Leave the ice on for 15-20 minutes at a time, 03-04 times a day.  Keep your leg raised (elevated) when possible to lessen swelling.  Avoid activities that cause ankle pain.  Follow specific exercises as directed by your caregiver.  Record how often you have ankle pain, the location of the pain, and what it feels like. This information may be helpful to you and your caregiver.  Ask your caregiver about returning to work or sports and whether you should drive.  Follow up with your caregiver for further examination, therapy, or testing as directed. SEEK MEDICAL CARE IF:   Pain or swelling continues or worsens beyond 1 week.  You have an oral temperature above 102 F (38.9 C).  You are feeling unwell or have chills.  You are having an increasingly difficult time with walking.  You have loss of sensation or other new symptoms.  You have questions or concerns. MAKE SURE YOU:   Understand these instructions.  Will watch your condition.  Will get help right away if you are not doing well or get worse.   This information is not intended to replace advice given to you  by your health care provider. Make sure you discuss any questions you have with your health care provider.   Document Released: 12/30/2009 Document Revised: 10/04/2011 Document Reviewed: 02/11/2015 Elsevier Interactive Patient Education [employer]

## 2015-10-01 NOTE — ED Notes (Signed)
Patient left at this time with all belongings. 

## 2015-10-01 NOTE — ED Provider Notes (Addendum)
CSN: [id number]     Arrival date & time 10/01/15  0027 History   First MD Initiated Contact with Patient 10/01/15 0522     Chief Complaint  Patient presents with  . Foot Pain     (Consider location/radiation/quality/duration/timing/severity/associated sxs/prior Treatment) HPI Charles Chase is a 35 y.o. male with no significant past medical history presenting today with right ankle pain. Patient states it started hurting earlier today while at rest. He denies any trauma or injury to the area. He does not know what happened. He denies any swelling. He states he can barely bear weight on it now. This has never happened to him in the past. He hurts on the medial aspect of his right ankle. He has not tried any medication to make this better. He has no further complaints.  10 Systems reviewed and are negative for acute change except as noted in the HPI.       Past Medical History  Diagnosis Date  . Asthma    History reviewed. No pertinent past surgical history. Family History  Problem Relation Age of Onset  . Diabetes Mother   . Hypertension Mother    Social History  Substance Use Topics  . Smoking status: Never Smoker   . Smokeless tobacco: None  . Alcohol Use: No    Review of Systems    Allergies  Review of patient's allergies indicates no known allergies.  Home Medications   Prior to Admission medications   Not on File   BP 119/66 mmHg  Pulse 64  Temp(Src) 98 F (36.7 C) (Oral)  Resp 16  Ht  (1.702 m)  Wt 135 lb (61.236 kg)  BMI 21.14 kg/m2  SpO2 98% Physical Exam  Constitutional: He is oriented to person, place, and time. Vital signs are normal. He appears well-developed and well-nourished.  Non-toxic appearance. He does not appear ill. No distress.  HENT:  Head: Normocephalic and atraumatic.  Nose: Nose normal.  Mouth/Throat: Oropharynx is clear and moist. No oropharyngeal exudate.  Eyes: Conjunctivae and EOM are normal. Pupils are equal, round,  and reactive to light. No scleral icterus.  Neck: Normal range of motion. Neck supple. No tracheal deviation, no edema, no erythema and normal range of motion present. No thyroid mass and no thyromegaly present.  Cardiovascular: Normal rate, regular rhythm, S1 normal, S2 normal, normal heart sounds, intact distal pulses and normal pulses.  Exam reveals no gallop and no friction rub.   No murmur heard. Pulmonary/Chest: Effort normal and breath sounds normal. No respiratory distress. He has no wheezes. He has no rhonchi. He has no rales.  Abdominal: Soft. Normal appearance and bowel sounds are normal. He exhibits no distension, no ascites and no mass. There is no hepatosplenomegaly. There is no tenderness. There is no rebound, no guarding and no CVA tenderness.  Musculoskeletal: Normal range of motion. He exhibits no edema or tenderness.  No tenderness to palpation or swelling of the right ankle. No gross deformity. Normal pulses and sensation distally.  Lymphadenopathy:    He has no cervical adenopathy.  Neurological: He is alert and oriented to person, place, and time. He has normal strength. No cranial nerve deficit or sensory deficit.  Skin: Skin is warm, dry and intact. No petechiae and no rash noted. He is not diaphoretic. No erythema. No pallor.  Psychiatric: He has a normal mood and affect. His behavior is normal. Judgment normal.  Nursing note and vitals reviewed.   ED Course  Procedures (including critical care  time) Labs Review Labs Reviewed - No data to display  Imaging Review Dg Ankle Complete Right  10/01/2015  CLINICAL DATA:  Anterior right ankle pain for 1 day. EXAM: RIGHT ANKLE - COMPLETE 3+ VIEW COMPARISON:  None. FINDINGS: There is no evidence of fracture, dislocation, or joint effusion. There is no evidence of arthropathy or other focal bone abnormality. Soft tissues are unremarkable. IMPRESSION: Negative. Electronically Signed   By: Burman NievesWilliam  Stevens M.D.   On: 10/01/2015  05:58   Dg Foot Complete Right  10/01/2015  CLINICAL DATA:  Pain all over the foot and into the ankle. No known trauma. EXAM: RIGHT FOOT COMPLETE - 3+ VIEW COMPARISON:  None. FINDINGS: There is no evidence of fracture or dislocation. There is no evidence of arthropathy or other focal bone abnormality. Soft tissues are unremarkable. IMPRESSION: Negative. Electronically Signed   By: Burman NievesWilliam  Stevens M.D.   On: 10/01/2015 01:07   I have personally reviewed and evaluated these images and lab results as part of my medical decision-making.   EKG Interpretation None      MDM   Final diagnoses:  Ankle pain, right    Patient presents to emergency department for right ankle pain. His physical exam is completely normal. I see no evidence of any cause for ankle pain. He has no warmth to the joint. Ice was applied, he was given Toradol for pain control. X-ray of his foot and ankle are negative. He was given an ankle brace.  PCP Follow-up advised. He appears well in no acute distress, vital signs were within his normal limits and is safe for discharge.  Tomasita CrumbleAdeleke Anyra Kaufman, MD 10/01/15 [id number]  Tomasita CrumbleAdeleke Ludia Gartland, MD 10/01/15 [id number]

## 2015-11-17 ENCOUNTER — Emergency Department (HOSPITAL_COMMUNITY)
Admission: EM | Admit: 2015-11-17 | Discharge: 2015-11-17 | Disposition: A | Discharge status: Home / Self Care | Payer: BLUE CROSS/BLUE SHIELD | Attending: Emergency Medicine | Admitting: Emergency Medicine

## 2015-11-17 ENCOUNTER — Encounter (HOSPITAL_COMMUNITY): Payer: Self-pay | Admitting: Emergency Medicine

## 2015-11-17 DIAGNOSIS — J45901 Unspecified asthma with (acute) exacerbation: Secondary | ICD-10-CM | POA: Insufficient documentation

## 2015-11-17 DIAGNOSIS — J45909 Unspecified asthma, uncomplicated: Secondary | ICD-10-CM | POA: Diagnosis present

## 2015-11-17 MED ORDER — ALBUTEROL SULFATE (2.5 MG/3ML) 0.083% IN NEBU
5.0000 mg | INHALATION_SOLUTION | Freq: Once | RESPIRATORY_TRACT | Status: AC
  Administered 2015-11-17: 5 mg via RESPIRATORY_TRACT

## 2015-11-17 MED ORDER — ALBUTEROL SULFATE HFA 108 (90 BASE) MCG/ACT IN AERS
2.0000 | INHALATION_SPRAY | RESPIRATORY_TRACT | Status: DC | PRN
  Administered 2015-11-17: 2 via RESPIRATORY_TRACT
  Filled 2015-11-17: qty 6.7

## 2015-11-17 MED ORDER — DEXAMETHASONE 4 MG PO TABS
10.0000 mg | ORAL_TABLET | Freq: Once | ORAL | Status: AC
  Administered 2015-11-17: 10 mg via ORAL
  Filled 2015-11-17: qty 3

## 2015-11-17 MED ORDER — ALBUTEROL SULFATE (2.5 MG/3ML) 0.083% IN NEBU
INHALATION_SOLUTION | RESPIRATORY_TRACT | Status: AC
  Filled 2015-11-17: qty 6

## 2015-11-17 MED ORDER — IPRATROPIUM-ALBUTEROL 0.5-2.5 (3) MG/3ML IN SOLN
3.0000 mL | Freq: Once | RESPIRATORY_TRACT | Status: AC
  Administered 2015-11-17: 3 mL via RESPIRATORY_TRACT
  Filled 2015-11-17: qty 3

## 2015-11-17 MED ORDER — AEROCHAMBER PLUS W/MASK MISC
1.0000 | Freq: Once | Status: AC
  Administered 2015-11-17: 1
  Filled 2015-11-17: qty 1

## 2015-11-17 NOTE — ED Notes (Signed)
Patient arrives with asthma flare. States onset today. Reports that he doesn't normally have trouble with it. Denies use of inhaler because he doesn't currently have one.

## 2015-11-17 NOTE — Discharge Instructions (Signed)
Start taking zyrtec or claritin once daily for your seasonal allergy symptoms.   Asthma, Adult Asthma is a recurring condition in which the airways tighten and narrow. Asthma can make it difficult to breathe. It can cause coughing, wheezing, and shortness of breath. Asthma episodes, also called asthma attacks, range from minor to life-threatening. Asthma cannot be cured, but medicines and lifestyle changes can help control it. CAUSES Asthma is believed to be caused by inherited (genetic) and environmental factors, but its exact cause is unknown. Asthma may be triggered by allergens, lung infections, or irritants in the air. Asthma triggers are different for each person. Common triggers include:   Animal dander.  Dust mites.  Cockroaches.  Pollen from trees or grass.  Mold.  Smoke.  Air pollutants such as dust, household cleaners, hair sprays, aerosol sprays, paint fumes, strong chemicals, or strong odors.  Cold air, weather changes, and winds (which increase molds and pollens in the air).  Strong emotional expressions such as crying or laughing hard.  Stress.  Certain medicines (such as aspirin) or types of drugs (such as beta-blockers).  Sulfites in foods and drinks. Foods and drinks that may contain sulfites include dried fruit, potato chips, and sparkling grape juice.  Infections or inflammatory conditions such as the flu, a cold, or an inflammation of the nasal membranes (rhinitis).  Gastroesophageal reflux disease (GERD).  Exercise or strenuous activity. SYMPTOMS Symptoms may occur immediately after asthma is triggered or many hours later. Symptoms include:  Wheezing.  Excessive nighttime or early morning coughing.  Frequent or severe coughing with a common cold.  Chest tightness.  Shortness of breath. DIAGNOSIS  The diagnosis of asthma is made by a review of your medical history and a physical exam. Tests may also be performed. These may include:  Lung  function studies. These tests show how much air you breathe in and out.  Allergy tests.  Imaging tests such as X-rays. TREATMENT  Asthma cannot be cured, but it can usually be controlled. Treatment involves identifying and avoiding your asthma triggers. It also involves medicines. There are 2 classes of medicine used for asthma treatment:   Controller medicines. These prevent asthma symptoms from occurring. They are usually taken every day.  Reliever or rescue medicines. These quickly relieve asthma symptoms. They are used as needed and provide short-term relief. Your health care provider will help you create an asthma action plan. An asthma action plan is a written plan for managing and treating your asthma attacks. It includes a list of your asthma triggers and how they may be avoided. It also includes information on when medicines should be taken and when their dosage should be changed. An action plan may also involve the use of a device called a peak flow meter. A peak flow meter measures how well the lungs are working. It helps you monitor your condition. HOME CARE INSTRUCTIONS   Take medicines only as directed by your health care provider. Speak with your health care provider if you have questions about how or when to take the medicines.  Use a peak flow meter as directed by your health care provider. Record and keep track of readings.  Understand and use the action plan to help minimize or stop an asthma attack without needing to seek medical care.  Control your home environment in the following ways to help prevent asthma attacks:  Do not smoke. Avoid being exposed to secondhand smoke.  Change your heating and air conditioning filter regularly.  Limit your  use of fireplaces and wood stoves.  Get rid of pests (such as roaches and mice) and their droppings.  Throw away plants if you see mold on them.  Clean your floors and dust regularly. Use unscented cleaning products.  Try  to have someone else vacuum for you regularly. Stay out of rooms while they are being vacuumed and for a short while afterward. If you vacuum, use a dust mask from a hardware store, a double-layered or microfilter vacuum cleaner bag, or a vacuum cleaner with a HEPA filter.  Replace carpet with wood, tile, or vinyl flooring. Carpet can trap dander and dust.  Use allergy-proof pillows, mattress covers, and box spring covers.  Wash bed sheets and blankets every week in hot water and dry them in a dryer.  Use blankets that are made of polyester or cotton.  Clean bathrooms and kitchens with bleach. If possible, have someone repaint the walls in these rooms with mold-resistant paint. Keep out of the rooms that are being cleaned and painted.  Wash hands frequently. SEEK MEDICAL CARE IF:   You have wheezing, shortness of breath, or a cough even if taking medicine to prevent attacks.  The colored mucus you cough up (sputum) is thicker than usual.  Your sputum changes from clear or white to yellow, green, gray, or bloody.  You have any problems that may be related to the medicines you are taking (such as a rash, itching, swelling, or trouble breathing).  You are using a reliever medicine more than 2-3 times per week.  Your peak flow is still at 50-79% of your personal best after following your action plan for 1 hour.  You have a fever. SEEK IMMEDIATE MEDICAL CARE IF:   You seem to be getting worse and are unresponsive to treatment during an asthma attack.  You are short of breath even at rest.  You get short of breath when doing very Alfreddie Consalvo physical activity.  You have difficulty eating, drinking, or talking due to asthma symptoms.  You develop chest pain.  You develop a fast heartbeat.  You have a bluish color to your lips or fingernails.  You are light-headed, dizzy, or faint.  Your peak flow is less than 50% of your personal best.   This information is not intended to replace  advice given to you by your health care provider. Make sure you discuss any questions you have with your health care provider.   Document Released: 07/12/2005 Document Revised: 04/02/2015 Document Reviewed: 02/08/2013 Elsevier Interactive Patient Education [employer]

## 2015-11-17 NOTE — ED Provider Notes (Signed)
CSN: [id number]     Arrival date & time 11/17/15  0331 History   First MD Initiated Contact with Patient 11/17/15 0445     Chief Complaint  Patient presents with  . Asthma     (Consider location/radiation/quality/duration/timing/severity/associated sxs/prior Treatment) HPI Comments: 35 year old male with a history of asthma who presents with asthma flare. Patient states that this evening around midnight he began having an asthma exacerbation. He has not had any problems with it recently so he does not have an inhaler at home. He reports wheezing and shortness of breath consistent with previous asthma exacerbations. He denies any pain. He reports a few days of mild cough and nasal congestion consistent with seasonal allergy symptoms. He does not take allergy medication aside from Benadryl. No fevers or recent illness.  Patient is a 35 y.o. male presenting with asthma. The history is provided by the patient.  Asthma    Past Medical History  Diagnosis Date  . Asthma    History reviewed. No pertinent past surgical history. Family History  Problem Relation Age of Onset  . Diabetes Mother   . Hypertension Mother    Social History  Substance Use Topics  . Smoking status: Never Smoker   . Smokeless tobacco: None  . Alcohol Use: No    Review of Systems 10 Systems reviewed and are negative for acute change except as noted in the HPI.   Allergies  Review of patient's allergies indicates no known allergies.  Home Medications   Prior to Admission medications   Not on File   BP 123/72 mmHg  Pulse 65  Temp(Src) 97.5 F (36.4 C) (Oral)  Resp 18  Ht  (1.727 m)  Wt 133 lb (60.328 kg)  BMI 20.23 kg/m2  SpO2 97% Physical Exam  Constitutional: He is oriented to person, place, and time. He appears well-developed and well-nourished. No distress.  HENT:  Head: Normocephalic and atraumatic.  Mouth/Throat: Oropharynx is clear and moist.  Moist mucous membranes  Eyes:  Conjunctivae are normal. Pupils are equal, round, and reactive to light.  Neck: Neck supple.  Cardiovascular: Normal rate, regular rhythm and normal heart sounds.   No murmur heard. Pulmonary/Chest: Effort normal.  Diminished BS b/l with expiratory wheezes R upper lung  Abdominal: Soft. Bowel sounds are normal. He exhibits no distension. There is no tenderness.  Musculoskeletal: He exhibits no edema.  Neurological: He is alert and oriented to person, place, and time.  Fluent speech  Skin: Skin is warm and dry.  Psychiatric: He has a normal mood and affect. Judgment normal.  Nursing note and vitals reviewed.   ED Course  Procedures (including critical care time) Labs Review Labs Reviewed - No data to display  Medications  albuterol (PROVENTIL) (2.5 MG/3ML) 0.083% nebulizer solution (not administered)  albuterol (PROVENTIL HFA;VENTOLIN HFA) 108 (90 Base) MCG/ACT inhaler 2 puff (2 puffs Inhalation Given 11/17/15 0523)  albuterol (PROVENTIL) (2.5 MG/3ML) 0.083% nebulizer solution 5 mg (5 mg Nebulization Given 11/17/15 0340)  ipratropium-albuterol (DUONEB) 0.5-2.5 (3) MG/3ML nebulizer solution 3 mL (3 mLs Nebulization Given 11/17/15 0525)  dexamethasone (DECADRON) tablet 10 mg (10 mg Oral Given 11/17/15 0524)  aerochamber plus with mask device 1 each (1 each Other Given 11/17/15 0530)     MDM   Final diagnoses:  Asthma exacerbation   Patient with history of asthma presents with wheezing and shortness of breath consistent with previous asthma episodes, no fevers or recent illness. Patient does endorse seasonal allergy symptoms. On exam, he was  diminished bilaterally with faint expiratory wheezes in his right upper lung. O2 sat 100% on room air. Gave DuoNeb and Decadron. Also provided albuterol inhaler with spacer to use at home. On reexamination after observation, the patient was resting comfortably with normal vital signs and stated that he felt much improved.   Given that the patient  denies any recent illness, I do not feel he needs a chest x-ray as he states his symptoms are similar to previous asthma exacerbations. Discussed supportive care including starting Zyrtec/Claritin and continue albuterol at home. Return precautions reviewed and patient discharged in satisfactory condition.   Laurence Spatesachel Morgan Emry Barbato, MD 11/17/15 [phone]

## 2015-11-17 NOTE — ED Notes (Signed)
Pt departed in NAD.  

## 2018-09-19 ENCOUNTER — Emergency Department (HOSPITAL_COMMUNITY)
Admission: EM | Admit: 2018-09-19 | Discharge: 2018-09-19 | Disposition: A | Discharge status: Home / Self Care | Payer: BLUE CROSS/BLUE SHIELD | Attending: Emergency Medicine | Admitting: Emergency Medicine

## 2018-09-19 ENCOUNTER — Encounter (HOSPITAL_COMMUNITY): Payer: Self-pay | Admitting: Emergency Medicine

## 2018-09-19 DIAGNOSIS — J45909 Unspecified asthma, uncomplicated: Secondary | ICD-10-CM | POA: Insufficient documentation

## 2018-09-19 DIAGNOSIS — M545 Low back pain: Secondary | ICD-10-CM | POA: Insufficient documentation

## 2018-09-19 MED ORDER — IBUPROFEN 600 MG PO TABS: 600.0000 mg | ORAL_TABLET | Freq: Four times a day (QID) | ORAL | 0 refills | Status: DC | PRN
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
## 2018-09-19 NOTE — ED Notes (Signed)
Discharge instructions discussed with Pt. Pt verbalized understanding. Pt stable and ambulatory. Pt declining signature pad.
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
## 2018-09-27 ENCOUNTER — Emergency Department (HOSPITAL_COMMUNITY): Payer: BLUE CROSS/BLUE SHIELD

## 2018-09-27 ENCOUNTER — Emergency Department (HOSPITAL_COMMUNITY)
Admission: EM | Admit: 2018-09-27 | Discharge: 2018-09-28 | Disposition: A | Discharge status: Home / Self Care | Payer: BLUE CROSS/BLUE SHIELD | Attending: Emergency Medicine | Admitting: Emergency Medicine

## 2018-09-27 ENCOUNTER — Encounter (HOSPITAL_COMMUNITY): Payer: Self-pay | Admitting: Emergency Medicine

## 2018-09-27 ENCOUNTER — Other Ambulatory Visit: Payer: Self-pay

## 2018-09-27 DIAGNOSIS — R7309 Other abnormal glucose: Secondary | ICD-10-CM | POA: Insufficient documentation

## 2018-09-27 DIAGNOSIS — J4541 Moderate persistent asthma with (acute) exacerbation: Secondary | ICD-10-CM | POA: Diagnosis not present

## 2018-09-27 DIAGNOSIS — R079 Chest pain, unspecified: Secondary | ICD-10-CM | POA: Diagnosis present

## 2018-09-27 LAB — CBC
HEMATOCRIT: 43.5 % (ref 39.0–52.0)
Hemoglobin: 13.4 g/dL (ref 13.0–17.0)
MCH: 25.5 pg — ABNORMAL LOW (ref 26.0–34.0)
MCHC: 30.8 g/dL (ref 30.0–36.0)
MCV: 82.9 fL (ref 80.0–100.0)
NRBC: 0 % (ref 0.0–0.2)
Platelets: 245 10*3/uL (ref 150–400)
RBC: 5.25 MIL/uL (ref 4.22–5.81)
RDW: 13.2 % (ref 11.5–15.5)
WBC: 6.9 10*3/uL (ref 4.0–10.5)

## 2018-09-27 LAB — BASIC METABOLIC PANEL
Anion gap: 8 (ref 5–15)
BUN: 13 mg/dL (ref 6–20)
CHLORIDE: 106 mmol/L (ref 98–111)
CO2: 22 mmol/L (ref 22–32)
Calcium: 9.4 mg/dL (ref 8.9–10.3)
Creatinine, Ser: 0.85 mg/dL (ref 0.61–1.24)
GFR calc non Af Amer: >60 mL/min (ref >60)
Glucose, Bld: 163 mg/dL — ABNORMAL HIGH (ref 70–99)
Potassium: 4.3 mmol/L (ref 3.5–5.1)
SODIUM: 136 mmol/L (ref 135–145)

## 2018-09-27 LAB — I-STAT TROPONIN, ED: Troponin i, poc: 0 ng/mL (ref 0.00–0.08)

## 2018-09-27 MED ORDER — SODIUM CHLORIDE 0.9% FLUSH: 3.0000 mL | Freq: Once | INTRAVENOUS | Status: DC

## 2018-09-27 NOTE — ED Triage Notes (Signed)
Pt c/o chest pain and shortness of breath that started last night. Denies other associated symptoms.

## 2018-09-28 ENCOUNTER — Other Ambulatory Visit: Payer: Self-pay

## 2018-09-28 LAB — I-STAT TROPONIN, ED: Troponin i, poc: 0 ng/mL (ref 0.00–0.08)

## 2018-09-28 MED ORDER — IPRATROPIUM-ALBUTEROL 0.5-2.5 (3) MG/3ML IN SOLN
3.0000 mL | Freq: Once | RESPIRATORY_TRACT | Status: AC
  Administered 2018-09-28: 3 mL via RESPIRATORY_TRACT
  Filled 2018-09-28: qty 3

## 2018-09-28 MED ORDER — PREDNISONE 20 MG PO TABS
60.0000 mg | ORAL_TABLET | Freq: Once | ORAL | Status: AC
  Administered 2018-09-28: 60 mg via ORAL
  Filled 2018-09-28: qty 3

## 2018-09-28 MED ORDER — ALBUTEROL SULFATE HFA 108 (90 BASE) MCG/ACT IN AERS
2.0000 | INHALATION_SPRAY | RESPIRATORY_TRACT | Status: DC | PRN
  Administered 2018-09-28: 2 via RESPIRATORY_TRACT
  Filled 2018-09-28: qty 6.7

## 2018-09-28 NOTE — Discharge Instructions (Addendum)
Your blood sugar was high today - 163. That may be because of the prednisone you are taking. Please have it rechecked in about two weeks.
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
## 2018-09-28 NOTE — ED Notes (Signed)
Reviewed d/c instructions with pt, who verbalized understanding and had no outstanding questions. [employer]band and pt labels removed and placed in shred bin. Pt departed in NAD.

## 2019-09-11 ENCOUNTER — Ambulatory Visit (HOSPITAL_COMMUNITY)
Admission: EM | Admit: 2019-09-11 | Discharge: 2019-09-11 | Disposition: A | Discharge status: Home / Self Care | Payer: BC Managed Care – PPO | Attending: Family Medicine | Admitting: Family Medicine

## 2019-09-11 ENCOUNTER — Other Ambulatory Visit: Payer: Self-pay

## 2019-09-11 ENCOUNTER — Encounter (HOSPITAL_COMMUNITY): Payer: Self-pay

## 2019-09-11 DIAGNOSIS — Z202 Contact with and (suspected) exposure to infections with a predominantly sexual mode of transmission: Secondary | ICD-10-CM

## 2019-09-11 LAB — RAPID HIV SCREEN (HIV 1/2 AB+AG)
HIV 1/2 Antibodies: NONREACTIVE
HIV-1 P24 Antigen - HIV24: NONREACTIVE

## 2019-09-11 LAB — HIV ANTIBODY (ROUTINE TESTING W REFLEX): HIV Screen 4th Generation wRfx: NONREACTIVE

## 2019-09-11 MED ORDER — DOXYCYCLINE HYCLATE 100 MG PO CAPS: 100.0000 mg | ORAL_CAPSULE | Freq: Two times a day (BID) | ORAL | 0 refills | Status: AC

## 2019-09-11 NOTE — ED Triage Notes (Signed)
Pt states his wife tested positive for chlamydia approx 3 weeks ago and pt requests STI testing. Denies penile discharge, fever, chills, or abd pain.

## 2019-09-11 NOTE — Discharge Instructions (Addendum)
Take antibiotic 2 x a day for 7 days Avoid sexual encounters for 7 days

## 2019-09-11 NOTE — ED Provider Notes (Signed)
[facility]    CSN: [id number] Arrival date & time: 09/11/19  0900      History   Chief Complaint Chief Complaint  Patient presents with  . SEXUALLY TRANSMITTED DISEASE    HPI Suren Payne is a 39 y.o. male.   HPI  Patient is here for STD testing and treatment since his wife was diagnosed with chlamydia He states he has had a discussion with his wife.  He does not feel like he needs blood work. He really just wants treatment for chlamydia but I told him that testing was mandatory. I recommend blood work for STDs HIV and RPR in addition  Past Medical History:  Diagnosis Date  . Asthma     There are no problems to display for this patient.   History reviewed. No pertinent surgical history.     Home Medications    Prior to Admission medications   Medication Sig Start Date End Date Taking? Authorizing Provider  doxycycline (VIBRAMYCIN) 100 MG capsule Take 1 capsule (100 mg total) by mouth 2 (two) times daily. 09/11/19   Raylene Everts, MD    Family History Family History  Problem Relation Age of Onset  . Diabetes Mother   . Hypertension Mother     Social History Social History   Tobacco Use  . Smoking status: Never Smoker  . Smokeless tobacco: Never Used  Substance Use Topics  . Alcohol use: Yes  . Drug use: Yes    Types: Marijuana     Allergies   Patient has no known allergies.   Review of Systems Review of Systems  Genitourinary: Negative.        Asymptomatic     Physical Exam Triage Vital Signs ED Triage Vitals  Enc Vitals Group     BP 09/11/19 0923 135/81     Pulse Rate 09/11/19 0923 61     Resp 09/11/19 0923 16     Temp 09/11/19 0923 97.6 F (36.4 C)     Temp Source 09/11/19 0923 Oral     SpO2 09/11/19 0923 98 %     Weight --      Height --      Head Circumference --      Peak Flow --      Pain Score 09/11/19 0922 0     Pain Loc --      Pain Edu? --      Excl. in [place]? --    No data found.  Updated  Vital Signs BP 135/81 (BP Location: Left Arm)   Pulse 61   Temp 97.6 F (36.4 C) (Oral)   Resp 16   SpO2 98%       Physical Exam Constitutional:      General: He is not in acute distress.    Appearance: He is well-developed.  HENT:     Head: Normocephalic and atraumatic.  Eyes:     Conjunctiva/sclera: Conjunctivae normal.     Pupils: Pupils are equal, round, and reactive to light.  Cardiovascular:     Rate and Rhythm: Normal rate.  Pulmonary:     Effort: Pulmonary effort is normal. No respiratory distress.  Abdominal:     General: There is no distension.     Palpations: Abdomen is soft.  Genitourinary:    Penis: Normal.      Comments: Normal circumcised penis.  No rash.  No discharge.  Specimen sent Musculoskeletal:        General: Normal range of motion.  Cervical back: Normal range of motion.  Skin:    General: Skin is warm and dry.  Neurological:     Mental Status: He is alert.  Psychiatric:        Mood and Affect: Mood normal.        Behavior: Behavior normal.      UC Treatments / Results  Labs (all labs ordered are listed, but only abnormal results are displayed) Labs Reviewed  HIV ANTIBODY (ROUTINE TESTING W REFLEX)  RAPID HIV SCREEN (HIV 1/2 AB+AG)  CYTOLOGY, (ORAL, ANAL, URETHRAL) ANCILLARY ONLY    EKG   Radiology No results found.  Procedures Procedures (including critical care time)  Medications Ordered in UC Medications - No data to display  Initial Impression / Assessment and Plan / UC Course  I have reviewed the triage vital signs and the nursing notes.  Pertinent labs & imaging results that were available during my care of the patient were reviewed by me and considered in my medical decision making (see chart for details).      Final Clinical Impressions(s) / UC Diagnoses   Final diagnoses:  Exposure to sexually transmitted disease (STD)     Discharge Instructions     Take antibiotic 2 x a day for 7 days Avoid sexual  encounters for 7 days   ED Prescriptions    Medication Sig Dispense Auth. Provider   doxycycline (VIBRAMYCIN) 100 MG capsule Take 1 capsule (100 mg total) by mouth 2 (two) times daily. 14 capsule Eustace Moore, MD     PDMP not reviewed this encounter.   Eustace Moore, MD 09/11/19 2142

## 2019-09-12 LAB — CYTOLOGY, (ORAL, ANAL, URETHRAL) ANCILLARY ONLY
Chlamydia: NEGATIVE
Neisseria Gonorrhea: NEGATIVE
Trichomonas: NEGATIVE

## 2022-09-06 ENCOUNTER — Other Ambulatory Visit: Payer: Self-pay

## 2022-09-06 ENCOUNTER — Encounter (HOSPITAL_COMMUNITY): Payer: Self-pay | Admitting: Emergency Medicine

## 2022-09-06 ENCOUNTER — Emergency Department (HOSPITAL_COMMUNITY)
Admission: EM | Admit: 2022-09-06 | Discharge: 2022-09-06 | Disposition: A | Discharge status: Home / Self Care | Payer: BC Managed Care – PPO | Attending: Emergency Medicine | Admitting: Emergency Medicine

## 2022-09-06 DIAGNOSIS — J029 Acute pharyngitis, unspecified: Secondary | ICD-10-CM

## 2022-09-06 DIAGNOSIS — J45909 Unspecified asthma, uncomplicated: Secondary | ICD-10-CM | POA: Diagnosis not present

## 2022-09-06 DIAGNOSIS — J028 Acute pharyngitis due to other specified organisms: Secondary | ICD-10-CM | POA: Diagnosis not present

## 2022-09-06 DIAGNOSIS — B9789 Other viral agents as the cause of diseases classified elsewhere: Secondary | ICD-10-CM | POA: Insufficient documentation

## 2022-09-06 LAB — GROUP A STREP BY PCR: Group A Strep by PCR: NOT DETECTED

## 2022-09-06 NOTE — ED Provider Notes (Signed)
[place] EMERGENCY DEPARTMENT AT [facility] Provider Note   CSN: [id number] Arrival date & time: 09/06/22  1035     History  Chief Complaint  Patient presents with   Sore Throat    Charles Chase is a 42 y.o. male.  Patient is a 42 year old male with prior history of asthma who is presenting today with complaints of a sore throat.  He reports it has been gradually worsening over the last 2 days.  He denies congestion, cough, shortness of breath.  He does not think that he has had a fever.  He just reports it is hurting so bad now he does not want to swallow.  He denies any swelling in his throat or change in voice.  No known sick contacts.  The history is provided by the patient.  Sore Throat       Home Medications Prior to Admission medications   Medication Sig Start Date End Date Taking? Authorizing Provider  doxycycline (VIBRAMYCIN) 100 MG capsule Take 1 capsule (100 mg total) by mouth 2 (two) times daily. 09/11/19   Raylene Everts, MD      Allergies    Patient has no known allergies.    Review of Systems   Review of Systems  Physical Exam Updated Vital Signs BP 124/73   Pulse 72   Temp 97.9 F (36.6 C) (Oral)   Resp 18   Ht 5' 9"$  (1.753 m)   Wt 59 kg   SpO2 98%   BMI 19.21 kg/m  Physical Exam Vitals and nursing note reviewed.  Constitutional:      General: He is not in acute distress.    Appearance: He is well-developed.  HENT:     Head: Normocephalic and atraumatic.     Mouth/Throat:     Pharynx: Uvula midline. Posterior oropharyngeal erythema present. No pharyngeal swelling, oropharyngeal exudate or uvula swelling.     Tonsils: No tonsillar exudate.  Eyes:     Conjunctiva/sclera: Conjunctivae normal.     Pupils: Pupils are equal, round, and reactive to light.  Cardiovascular:     Rate and Rhythm: Normal rate.  Pulmonary:     Effort: Pulmonary effort is normal. No respiratory distress.  Musculoskeletal:        General: No  tenderness. Normal range of motion.     Cervical back: Normal range of motion and neck supple.  Lymphadenopathy:     Cervical: Cervical adenopathy present.  Skin:    General: Skin is warm and dry.     Findings: No erythema or rash.  Neurological:     Mental Status: He is alert and oriented to person, place, and time.  Psychiatric:        Behavior: Behavior normal.     ED Results / Procedures / Treatments   Labs (all labs ordered are listed, but only abnormal results are displayed) Labs Reviewed  GROUP A STREP BY PCR    EKG None  Radiology No results found.  Procedures Procedures    Medications Ordered in ED Medications - No data to display  ED Course/ Medical Decision Making/ A&P                             Medical Decision Making  Patient presenting today with symptoms consistent with pharyngitis.  Strep versus viral in nature.  No findings to suggest epiglottitis, RPA, PTA. Strep neg.  Will d/c home with supportive care for viral  pharyngitis.        Final Clinical Impression(s) / ED Diagnoses Final diagnoses:  Viral pharyngitis    Rx / DC Orders ED Discharge Orders     None         Blanchie Dessert, MD 09/06/22 1243

## 2022-09-06 NOTE — Discharge Instructions (Signed)
Try Cepacol lozenges or spray to help with pain.  You could also take 2 Tylenol and 2 ibuprofen together every 6 hours to help with the pain 2.  No signs of strep throat at this time.  This is viral and should improve in the next 3 to 4 days.

## 2022-09-06 NOTE — ED Triage Notes (Signed)
Pt complains of sore throat x 3 days. Denies any fever, sob, vomiting or difficulty swallowing. No meds PTA.
